# Patient Record
Sex: Female | Born: 1985 | Race: Black or African American | Hispanic: No | Marital: Married | Smoking: Never smoker
Health system: Southern US, Community
[De-identification: ages and names within clinical notes are randomized; demographics above are authoritative.]

## PROBLEM LIST (undated history)

## (undated) DIAGNOSIS — T7840XA Allergy, unspecified, initial encounter: Secondary | ICD-10-CM

## (undated) DIAGNOSIS — R51 Headache: Secondary | ICD-10-CM

## (undated) DIAGNOSIS — R519 Headache, unspecified: Secondary | ICD-10-CM

## (undated) DIAGNOSIS — D649 Anemia, unspecified: Secondary | ICD-10-CM

## (undated) HISTORY — DX: Headache: R51

## (undated) HISTORY — PX: DILATION AND CURETTAGE OF UTERUS: SHX78

## (undated) HISTORY — DX: Anemia, unspecified: D64.9

## (undated) HISTORY — DX: Headache, unspecified: R51.9

## (undated) HISTORY — PX: TONSILLECTOMY: SUR1361

## (undated) HISTORY — PX: WISDOM TOOTH EXTRACTION: SHX21

## (undated) HISTORY — PX: CHOLECYSTECTOMY: SHX55

## (undated) HISTORY — DX: Allergy, unspecified, initial encounter: T78.40XA

---

## 2004-03-12 ENCOUNTER — Inpatient Hospital Stay (HOSPITAL_COMMUNITY): Admission: AD | Admit: 2004-03-12 | Discharge: 2004-03-12 | Payer: Self-pay | Admitting: Obstetrics and Gynecology

## 2005-01-13 ENCOUNTER — Other Ambulatory Visit: Admission: RE | Admit: 2005-01-13 | Discharge: 2005-01-13 | Payer: Self-pay | Admitting: Obstetrics and Gynecology

## 2005-09-30 ENCOUNTER — Emergency Department (HOSPITAL_COMMUNITY): Admission: EM | Admit: 2005-09-30 | Discharge: 2005-10-01 | Payer: Self-pay | Admitting: Emergency Medicine

## 2009-05-15 ENCOUNTER — Emergency Department (HOSPITAL_COMMUNITY): Admission: EM | Admit: 2009-05-15 | Discharge: 2009-05-15 | Payer: Self-pay | Admitting: Emergency Medicine

## 2010-04-19 ENCOUNTER — Ambulatory Visit: Payer: Self-pay | Admitting: Oncology

## 2010-05-23 ENCOUNTER — Ambulatory Visit (HOSPITAL_COMMUNITY): Admission: RE | Admit: 2010-05-23 | Discharge: 2010-05-23 | Payer: Self-pay | Admitting: Obstetrics and Gynecology

## 2010-12-23 LAB — URINALYSIS, ROUTINE W REFLEX MICROSCOPIC
Bilirubin Urine: NEGATIVE
Ketones, ur: NEGATIVE mg/dL
Nitrite: NEGATIVE
Protein, ur: NEGATIVE mg/dL
Specific Gravity, Urine: >1.03 — ABNORMAL HIGH (ref 1.005–1.030)
Urobilinogen, UA: 1 mg/dL (ref 0.0–1.0)

## 2010-12-23 LAB — CBC
MCH: 18.9 pg — ABNORMAL LOW (ref 26.0–34.0)
MCHC: 31.6 g/dL (ref 30.0–36.0)
MCV: 59.9 fL — ABNORMAL LOW (ref 78.0–100.0)
Platelets: 360 10*3/uL (ref 150–400)
RDW: 22 % — ABNORMAL HIGH (ref 11.5–15.5)
WBC: 10.7 10*3/uL — ABNORMAL HIGH (ref 4.0–10.5)

## 2011-01-15 LAB — COMPREHENSIVE METABOLIC PANEL
ALT: 13 U/L (ref 0–35)
Alkaline Phosphatase: 70 U/L (ref 39–117)
BUN: 9 mg/dL (ref 6–23)
CO2: 24 mEq/L (ref 19–32)
Calcium: 9.3 mg/dL (ref 8.4–10.5)
GFR calc non Af Amer: >60 mL/min (ref >60)
Glucose, Bld: 99 mg/dL (ref 70–99)
Total Protein: 7.7 g/dL (ref 6.0–8.3)

## 2011-01-15 LAB — URINALYSIS, ROUTINE W REFLEX MICROSCOPIC
Bilirubin Urine: NEGATIVE
Ketones, ur: NEGATIVE mg/dL
Nitrite: NEGATIVE
Urobilinogen, UA: 1 mg/dL (ref 0.0–1.0)
pH: 6 (ref 5.0–8.0)

## 2011-01-15 LAB — DIFFERENTIAL
Basophils Relative: 0 % (ref 0–1)
Eosinophils Absolute: 0.1 10*3/uL (ref 0.0–0.7)
Eosinophils Relative: 1 % (ref 0–5)
Lymphocytes Relative: 13 % (ref 12–46)
Monocytes Absolute: 0.8 10*3/uL (ref 0.1–1.0)
Neutrophils Relative %: 79 % — ABNORMAL HIGH (ref 43–77)

## 2011-01-15 LAB — POCT PREGNANCY, URINE: Preg Test, Ur: NEGATIVE

## 2011-01-15 LAB — URINE MICROSCOPIC-ADD ON

## 2011-01-15 LAB — LIPASE, BLOOD: Lipase: 31 U/L (ref 11–59)

## 2011-01-15 LAB — CBC
HCT: 37.6 % (ref 36.0–46.0)
Hemoglobin: 12.8 g/dL (ref 12.0–15.0)
MCHC: 34 g/dL (ref 30.0–36.0)
RBC: 5.51 MIL/uL — ABNORMAL HIGH (ref 3.87–5.11)
RDW: 18 % — ABNORMAL HIGH (ref 11.5–15.5)

## 2011-02-19 IMAGING — US US ABDOMEN COMPLETE
1 series · 14 of 25 positions shown · non-contrast
Comparison: None.

CLINICAL DATA: Abdominal pain

COMPLETE ABDOMINAL ULTRASOUND

[Series 1: us abdomen complete · 0.30mm/px · 14 of 43 slices shown]
[im 1/43]
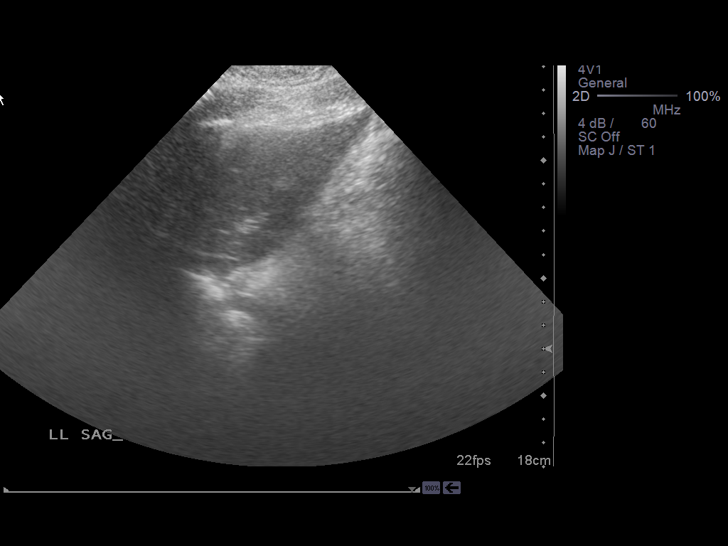
[im 4/43]
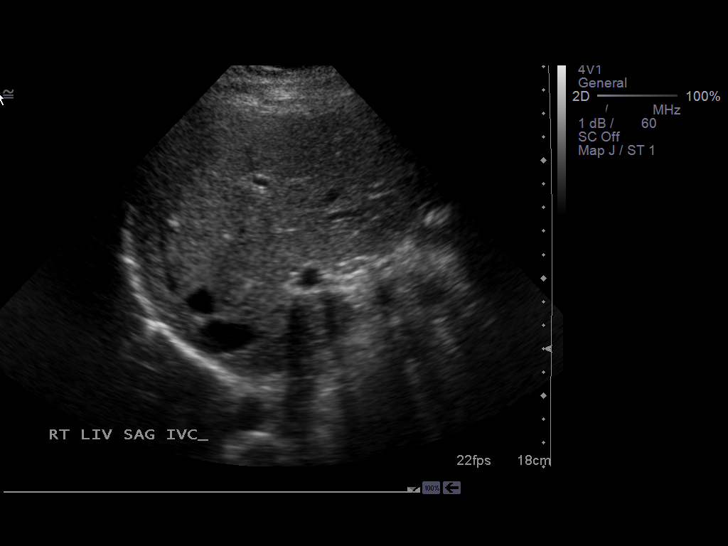
[im 8/43]
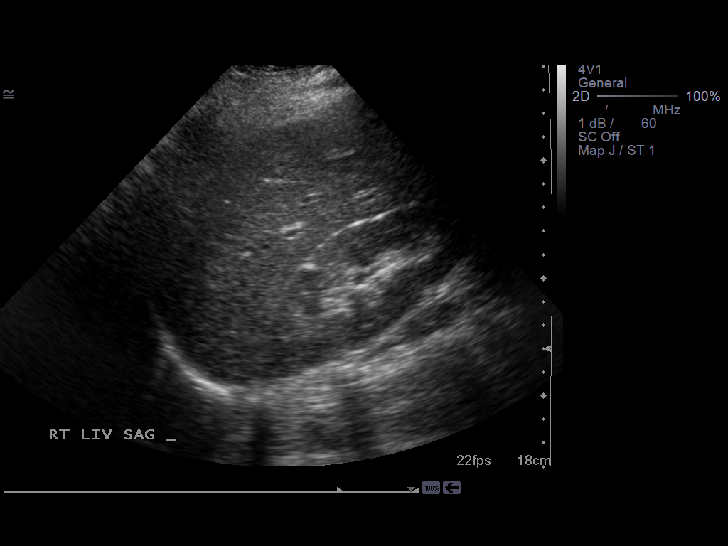
[im 11/43]
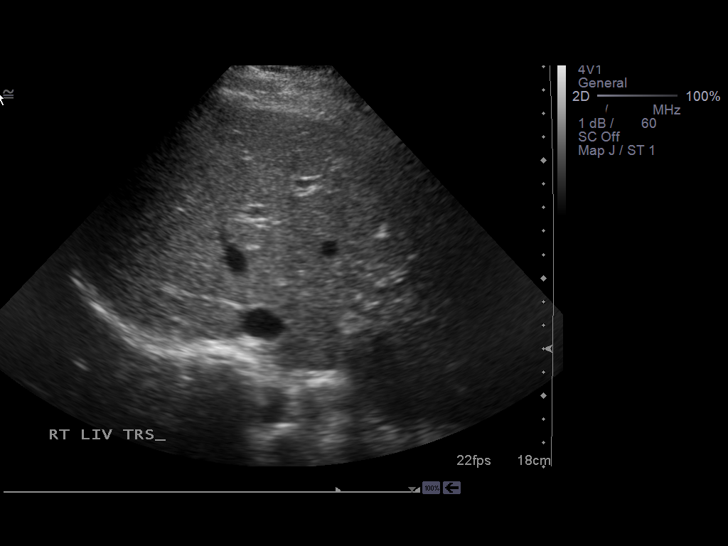
[im 15/43]
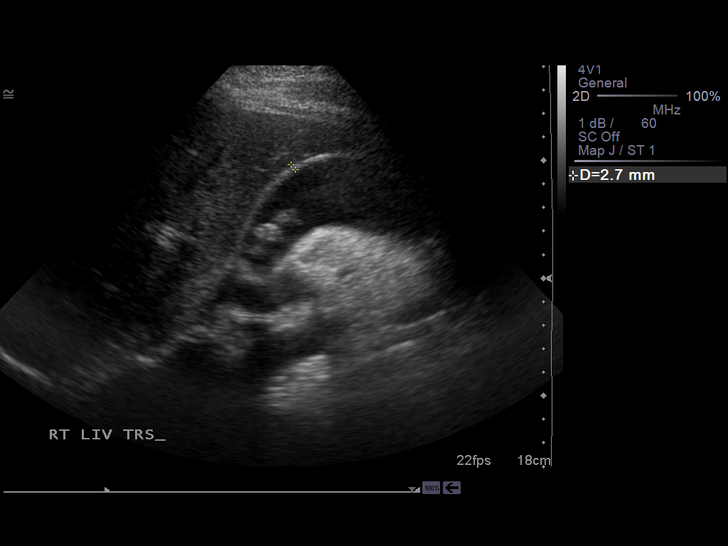
[im 16/43]
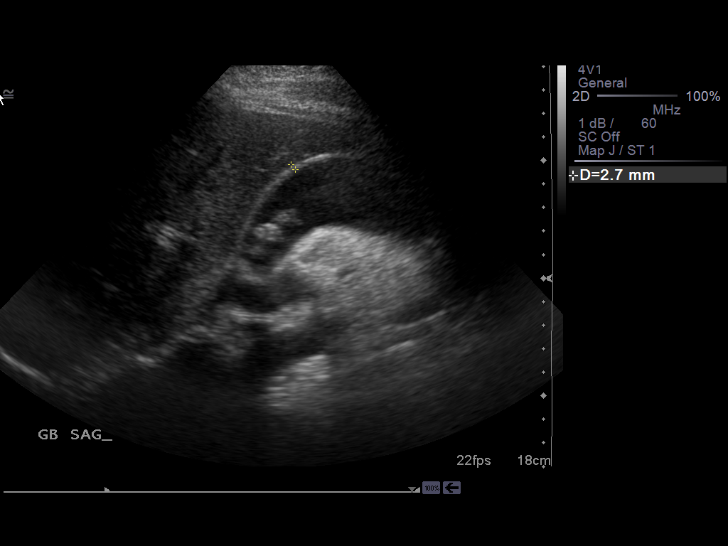
[im 20/43]
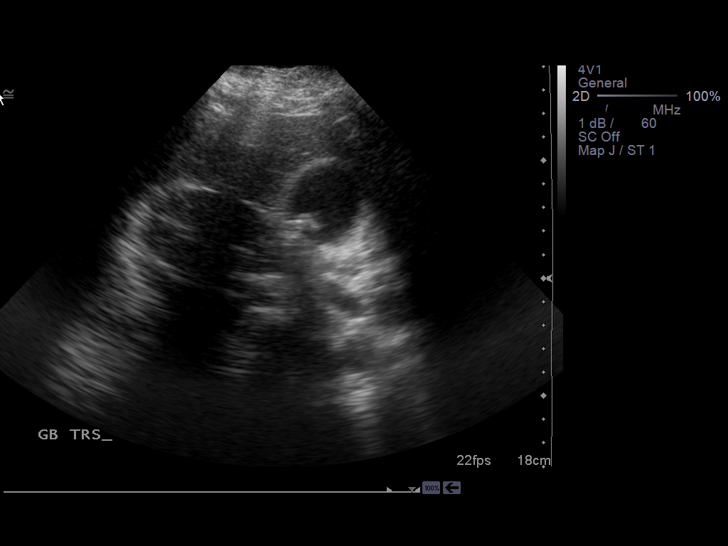
[im 23/43]
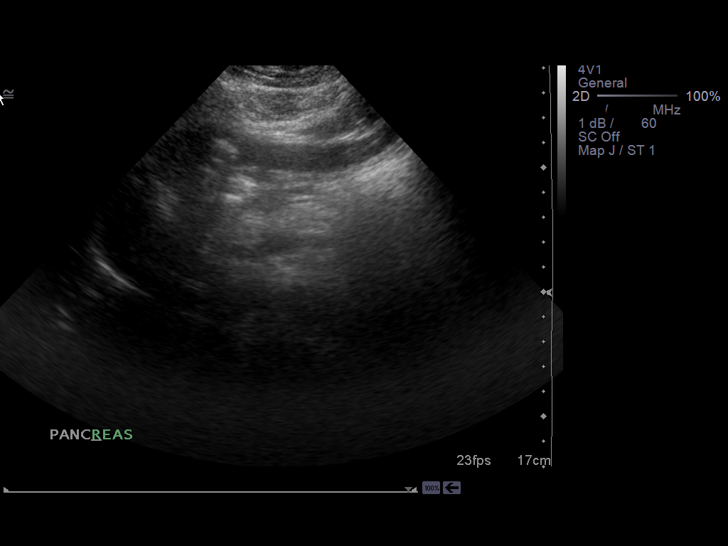
[im 27/43]
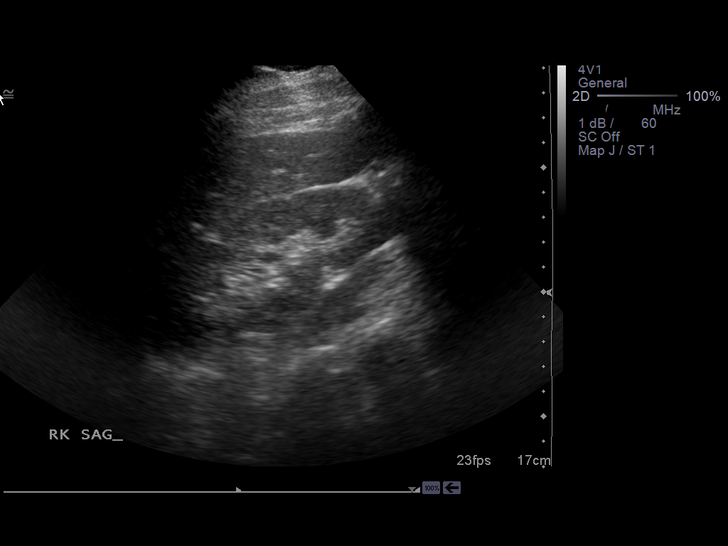
[im 29/43]
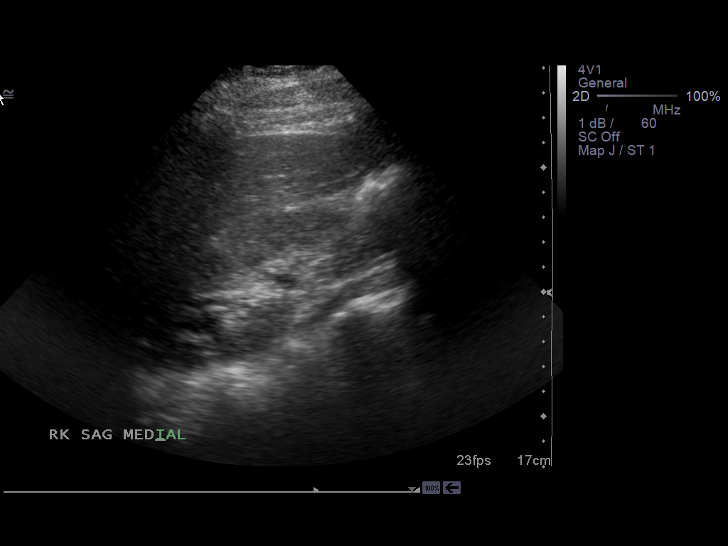
[im 32/43]
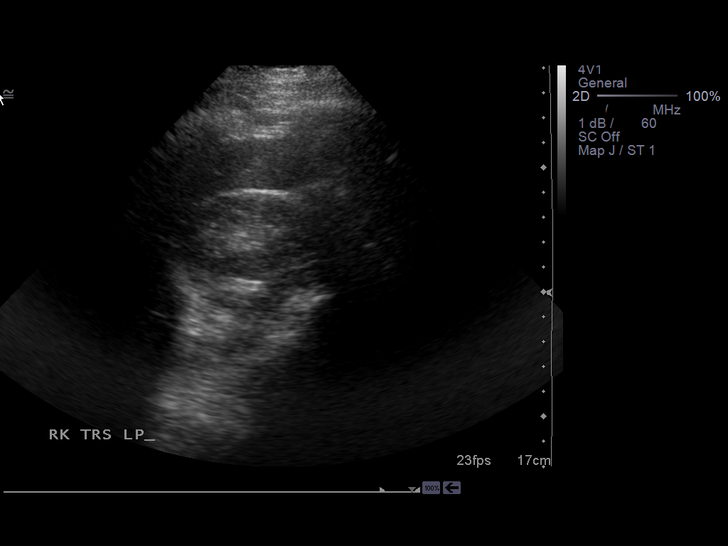
[im 36/43]
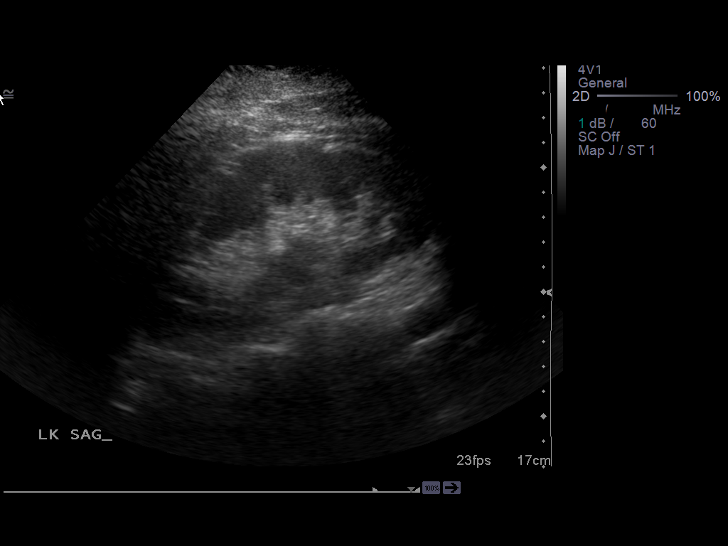
[im 39/43]
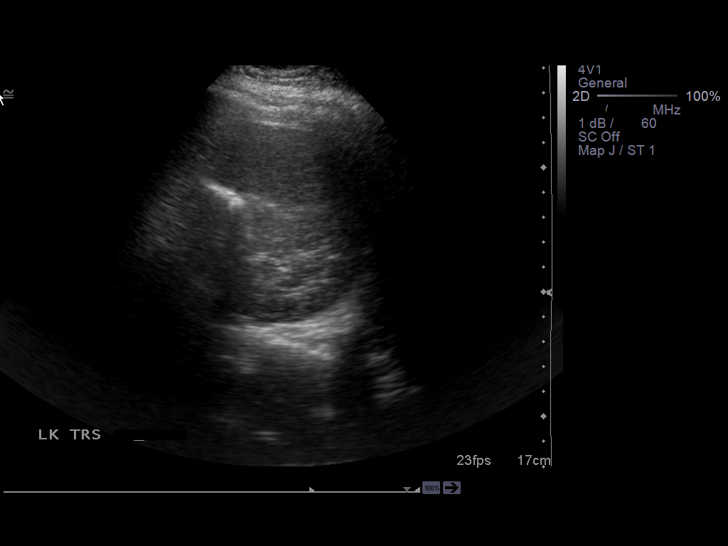
[im 43/43]
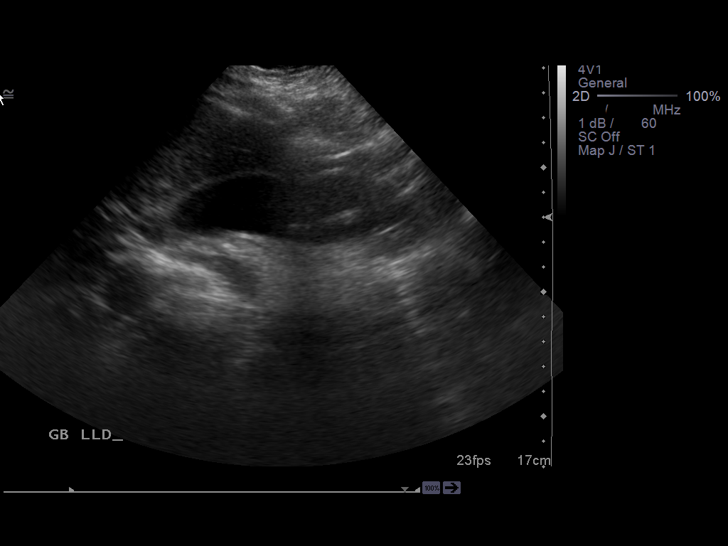

[14 of 25 positions shown; findings below may reference images not displayed]

FINDINGS: Gallbladder:  Multiple gallstones noted, several  are mobile
largest measures centimeter in diameter.  No gallbladder wall
thickening, Murphy's sign or pericholecystic fluid.  Gallbladder
wall thickness measures 2.7 mm.

Common bile duct:  Nondilated, within normal limits

Liver:  No focal abnormality, no intrahepatic biliary dilatation
identified.

IVC:  Within normal limits, patent.

Pancreas:  Limited visualization but no focal abnormality of the
imaged portion.

Spleen:  11.9 cm length.  No acute finding.

Right Kidney:  11.7 cm length.  No hydronephrosis or acute finding

Left Kidney:  12.2 cm length.  No hydronephrosis or acute finding

Abdominal aorta:  Negative for aneurysm.  Maximal diameter 2 cm

No free fluid
IMPRESSION: Cholelithiasis.
No biliary dilatation or other acute finding.

## 2011-03-01 ENCOUNTER — Emergency Department (HOSPITAL_COMMUNITY): Payer: BC Managed Care – PPO

## 2011-03-01 ENCOUNTER — Inpatient Hospital Stay (HOSPITAL_COMMUNITY)
Admission: EM | Admit: 2011-03-01 | Discharge: 2011-03-03 | DRG: 494 | Disposition: A | Discharge status: Home / Self Care | Payer: BC Managed Care – PPO | Attending: General Surgery | Admitting: General Surgery

## 2011-03-01 DIAGNOSIS — K810 Acute cholecystitis: Principal | ICD-10-CM | POA: Diagnosis present

## 2011-03-01 LAB — COMPREHENSIVE METABOLIC PANEL
ALT: 25 U/L (ref 0–35)
Alkaline Phosphatase: 70 U/L (ref 39–117)
BUN: 9 mg/dL (ref 6–23)
CO2: 25 mEq/L (ref 19–32)
Chloride: 104 mEq/L (ref 96–112)
GFR calc non Af Amer: >60 mL/min (ref >60)
Glucose, Bld: 95 mg/dL (ref 70–99)
Potassium: 3.5 mEq/L (ref 3.5–5.1)
Sodium: 137 mEq/L (ref 135–145)
Total Bilirubin: 0.5 mg/dL (ref 0.3–1.2)
Total Protein: 7.5 g/dL (ref 6.0–8.3)

## 2011-03-01 LAB — URINALYSIS, ROUTINE W REFLEX MICROSCOPIC
Bilirubin Urine: NEGATIVE
Ketones, ur: NEGATIVE mg/dL
Specific Gravity, Urine: 1.025 (ref 1.005–1.030)
Urobilinogen, UA: 2 mg/dL — ABNORMAL HIGH (ref 0.0–1.0)

## 2011-03-01 LAB — CBC
HCT: 35 % — ABNORMAL LOW (ref 36.0–46.0)
Hemoglobin: 12 g/dL (ref 12.0–15.0)
MCV: 61.4 fL — ABNORMAL LOW (ref 78.0–100.0)
RBC: 5.7 MIL/uL — ABNORMAL HIGH (ref 3.87–5.11)
WBC: 9.7 10*3/uL (ref 4.0–10.5)

## 2011-03-01 LAB — URINE MICROSCOPIC-ADD ON

## 2011-03-01 LAB — LIPASE, BLOOD: Lipase: 50 U/L (ref 11–59)

## 2011-03-01 LAB — POCT PREGNANCY, URINE: Preg Test, Ur: NEGATIVE

## 2011-03-02 ENCOUNTER — Other Ambulatory Visit: Payer: Self-pay | Admitting: General Surgery

## 2011-03-02 ENCOUNTER — Inpatient Hospital Stay (HOSPITAL_COMMUNITY): Payer: BC Managed Care – PPO

## 2011-03-02 LAB — SURGICAL PCR SCREEN
MRSA, PCR: NEGATIVE
Staphylococcus aureus: NEGATIVE

## 2011-03-02 LAB — URINE CULTURE

## 2011-03-07 NOTE — Op Note (Signed)
Michele Warren, Michele Warren               ACCOUNT NO.:  [id number]  MEDICAL RECORD NO.:  [id number]           PATIENT TYPE:  I  LOCATION:  5151                         FACILITY:  [facility]  PHYSICIAN:  Gabrielle Dare. [name], M.D.DATE OF BIRTH:  04-26-1986  DATE OF PROCEDURE:  03/02/2011 DATE OF DISCHARGE:                              OPERATIVE REPORT   PREOPERATIVE DIAGNOSIS:  Acute cholecystitis.  POSTOPERATIVE DIAGNOSIS:  Acute cholecystitis.  PROCEDURE:  Laparoscopic cholecystectomy with intraoperative cholangiogram.  SURGEON:  Gabrielle Dare. [name], MD  ASSISTANT:  Brayton El, PA-C  HISTORY OF PRESENT ILLNESS:  Michele Warren is a 25 year old African American female who was admitted with acute cholecystitis.  She was brought to the operating room for cholecystectomy.  PROCEDURE IN DETAIL:  Informed consent was obtained and the patient was identified in the preop holding area.  She received intravenous antibiotics on a regimen.  General endotracheal anesthesia was administered by Anesthesia staff.  Her abdomen was prepped and draped in a sterile fashion.  We did a time-out procedure.  The infraumbilical region was infiltrated with 0.25% Marcaine with epinephrine. Infraumbilical incision was made.  Subcutaneous tissues were dissected down revealing the anterior fascia.  This was divided sharply.  The peritoneal cavity was entered under direct vision without difficulty.  A 0-Vicryl pursestring suture was placed around the fascial opening and the Hasson trocar was inserted in the abdomen.  The abdomen was insufflated with carbon dioxide in standard fashion.  Under direct vision, a 5-mm epigastric and two 5-mm right lateral ports were placed with local anesthetic used at all port sites.  Laparoscopic exploration revealed acute inflammation of the gallbladder.  The abdomen was retracted superomedially.  The infundibulum was retracted inferolaterally.  Dissection began laterally and progressed  medially easily identifying the cystic duct.  This was circumferentially dissected until a large window was made between the infundibulum, cystic duct, and the liver.  Once we had a critical view, a clip was placed on the infundibulum-cystic duct junction.  A small nick was made in the cystic duct and a Cook cholangiogram catheter was inserted. Intraoperative cholangiogram was obtained demonstrating no common bile duct filling defects and good flow of contrast into the duodenum.  The cholangiogram catheter was removed.  Three clips were placed proximally on the cystic duct and it was divided.  Further dissection revealed the anterior and posterior branch of the cystic artery.  These were clipped twice proximally and divided distally with cautery.  The gallbladder was taken off the liver bed with Bovie cautery achieving excellent hemostasis along the way.  The gallbladder was placed in an EndoCatch bag and removed from the abdomen via the infraumbilical port sites.  The liver bed was then double checked.  Hemostasis was assured.  The abdomen was copiously irrigated.  The irrigation fluid returned clear.  Clips were noted to be in good position and the liver bed remained dry.  Ports were removed under direct vision.  Pneumoperitoneum was released.  The infraumbilical fascia was closed by tying a 0-Vicryl pursestring suture with care not to trap the intra-abdominal contents.  All 4 wounds were copiously irrigated and  the skin of each was closed with running 4-0 Vicryl subcuticular stitch followed by Dermabond.  Sponge, needle, and instrument counts were all correct.  The patient tolerated the procedure well without apparent complication and was taken to the recovery room in stable condition.     Gabrielle Dare [name], M.D.     BET/MEDQ  D:  03/02/2011  T:  03/03/2011  Job:  [id number]  Electronically Signed by Violeta Gelinas M.D. on 03/07/2011 01:16:39 PM

## 2011-03-07 NOTE — H&P (Signed)
  NAMEAARNA, MIHALKO NO.:  [id number]  MEDICAL RECORD NO.:  [id number]           PATIENT TYPE:  I  LOCATION:  5151                         FACILITY:  [facility]  PHYSICIAN:  Juanetta Gosling, MDDATE OF BIRTH:  12/19/1985  DATE OF ADMISSION:  03/01/2011 DATE OF DISCHARGE:                             HISTORY & PHYSICAL   CHIEF COMPLAINT:  Abdominal pain.  HISTORY OF PRESENT ILLNESS:  This a 25 year old female who is otherwise fairly healthy who had an episode of right upper quadrant pain and right back pain that she noted in 2010 when she was evaluated and diagnosed with cholelithiasis.  She did fine after that and did not seek any other medical attention until about 2 days ago when she began developing severe right upper quadrant pain associated with right back pain as well.  She has had nausea and vomiting associated with that.  She has also had some loose stool.  She denies any fever.  She has had some chills.  She is eating only a small amount secondary to the pain.  She has got no real relief which prompted her to come to the emergency room. Aggravating factors are eating and movement.  PAST MEDICAL HISTORY:  Negative.  PAST SURGICAL HISTORY:  D and C and tonsillectomy.  FAMILY HISTORY:  Positive for gallstones.  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 She drinks occasional alcohol and is a nonsmoker.  DRUG ALLERGIES:  None known.  She does, however, state she has "a weird feeling" to hydrocodone and codeine.  MEDICATIONS:  Oral contraceptives.  REVIEW OF SYSTEMS:  Otherwise negative.  PHYSICAL EXAMINATION:  VITAL SIGNS:  Temperature 98.0, heart rate 88, respiratory rate 20, and blood pressure 117/86. GENERAL:  She is a well-appearing female in no distress. NECK:  Supple without adenopathy. HEART:  Regular rate and rhythm. LUNGS:  Clear bilaterally. ABDOMEN:  Soft, obese.  She has some tenderness of the right  upper quadrant with a positive Murphy sign.  Ultrasound evaluation shows a wall like of shadow complex with multiple stones, sonographic Murphy sign, and wall thickness of 1.2 mm.  Common duct is 4.4 mm in size.  Her urinalysis shows she has small leukocytes, moderate blood, 2+ urobilinogen.  Her urine microscopy shows 7-10 white cells, many bacteria, and few squamous cells.  Urine pregnancy test is negative.  Lipase is 50.  Her CMP is unremarkable except for an albumin of 3.4.  Her CBC shows a white blood cell count of 9.7, hematocrit 35, and platelets of 358.  ASSESSMENT:  Acute cholecystitis.  PLAN: 1. Admission, n.p.o., IV antibiotics. 2. We discussed laparoscopic cholecystectomy as the treatment for     cholecystitis and the risks and benefits associated with that.  She     understands and this will be performed tomorrow by one of my     partners.     Juanetta Gosling, MD     MCW/MEDQ  D:  03/01/2011  T:  03/02/2011  Job:  [id number]  Electronically Signed by Emelia Loron MD on 03/07/2011 09:09:37 PM

## 2011-03-14 ENCOUNTER — Encounter (INDEPENDENT_AMBULATORY_CARE_PROVIDER_SITE_OTHER): Payer: Self-pay | Admitting: General Surgery

## 2012-12-05 IMAGING — US US ABDOMEN COMPLETE
1 series · 13 of 25 positions shown · non-contrast
Comparison: 05/15/2009

CLINICAL DATA: Abdominal pain.

COMPLETE ABDOMINAL ULTRASOUND

[Series 1: us abdomen complete · 0.30mm/px · 13 of 47 slices shown]
[im 1/47]
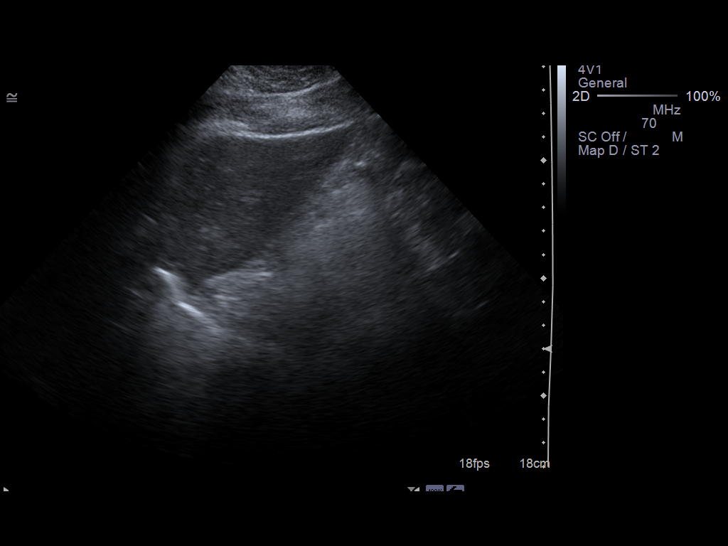
[im 4/47]
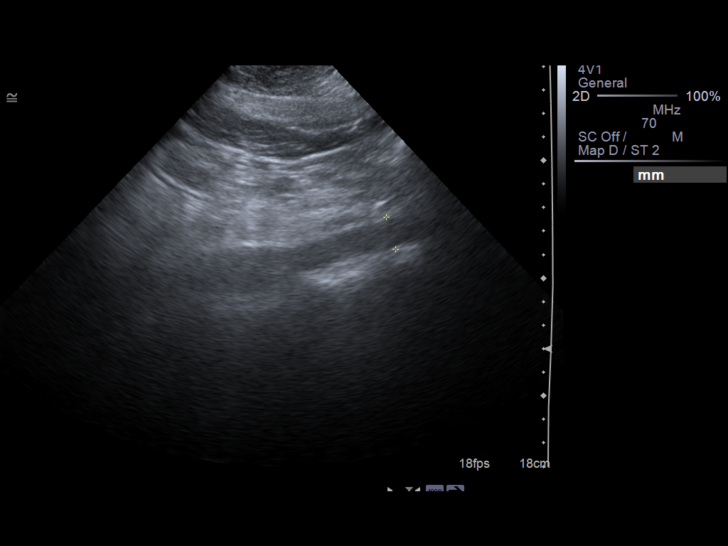
[im 8/47]
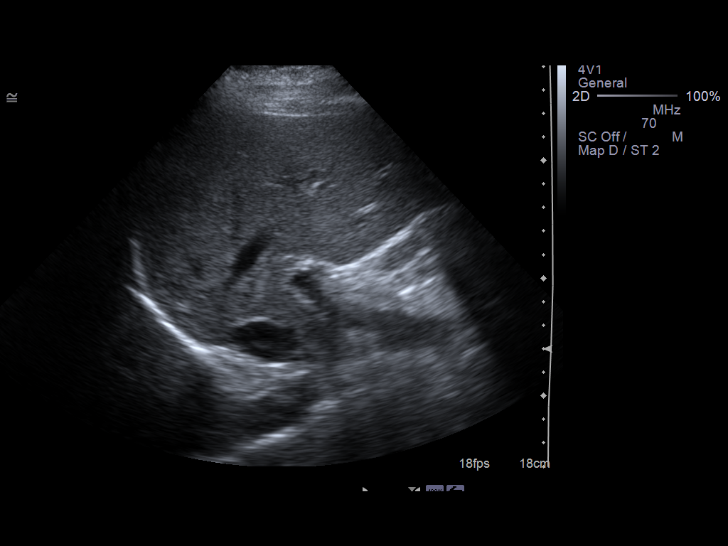
[im 12/47]
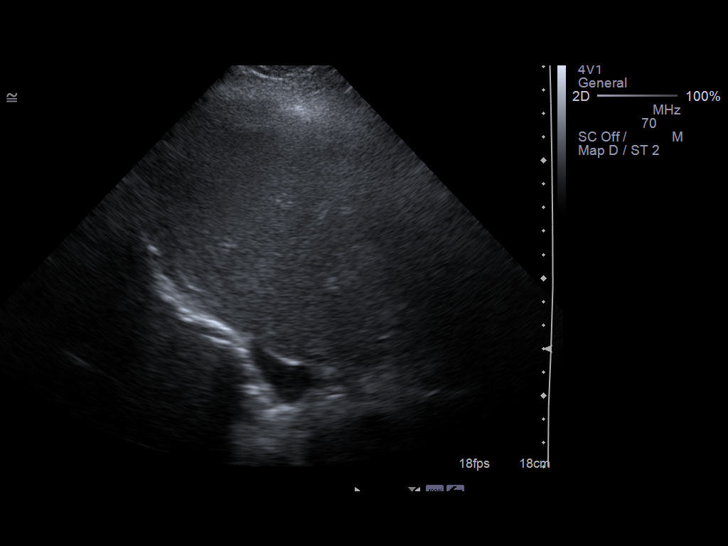
[im 16/47]
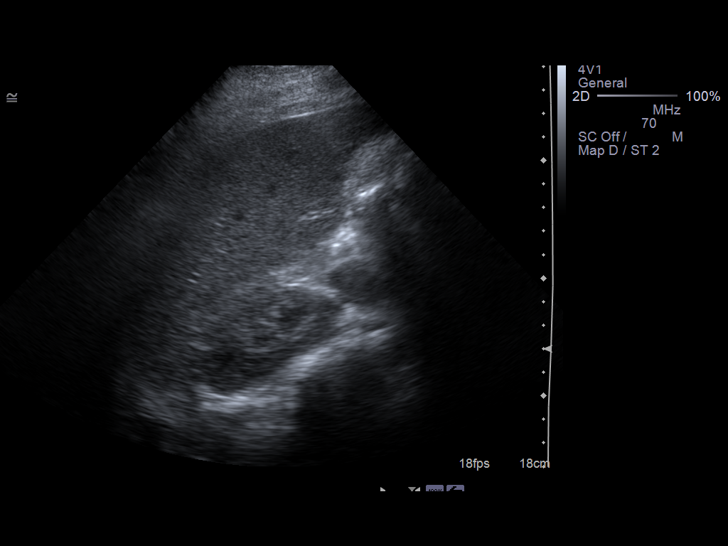
[im 20/47]
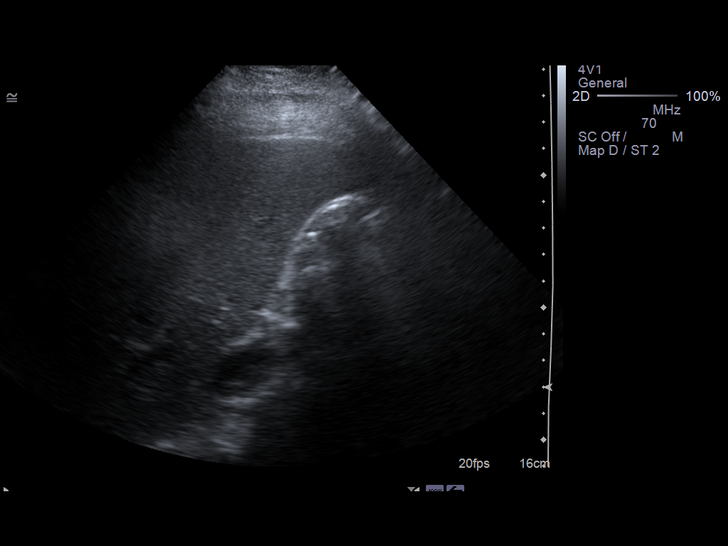
[im 24/47]
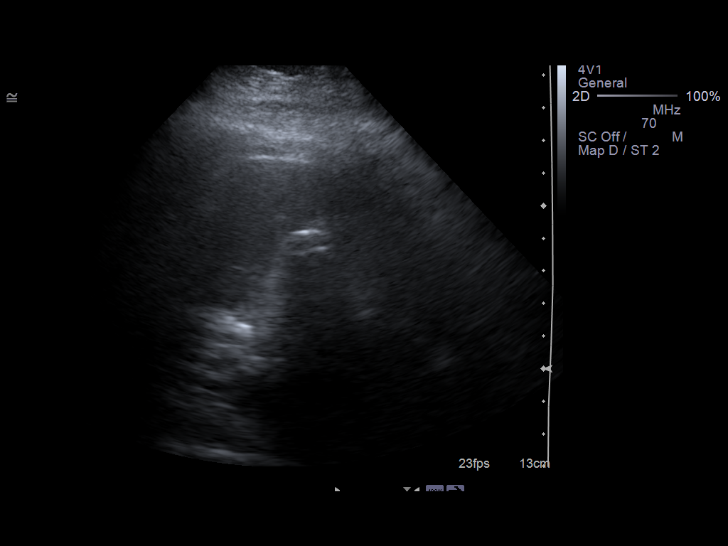
[im 27/47]
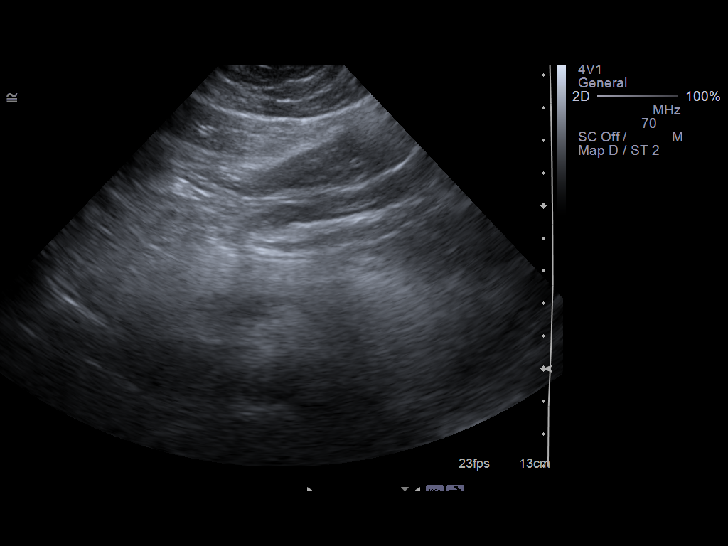
[im 31/47]
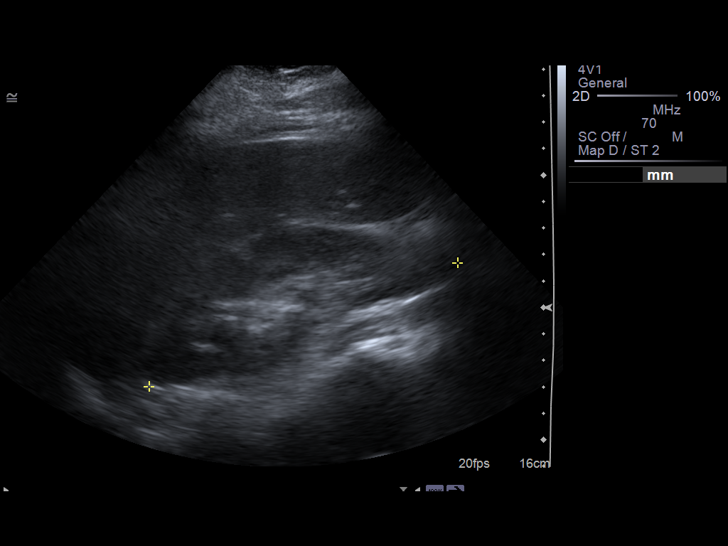
[im 35/47]
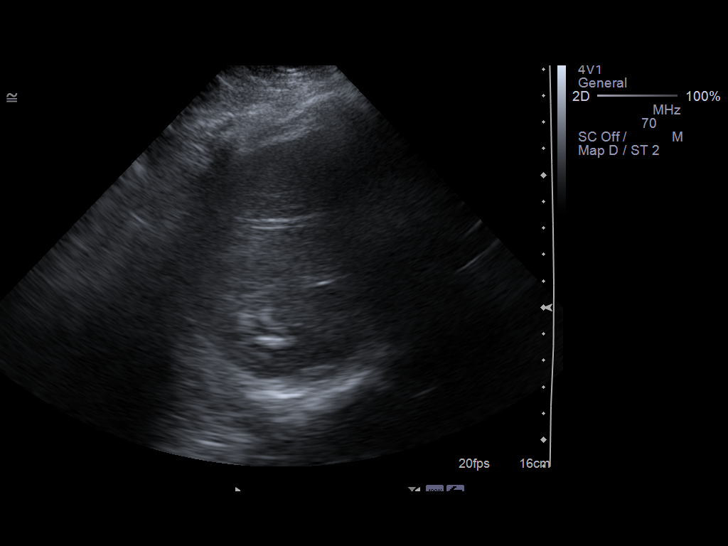
[im 39/47]
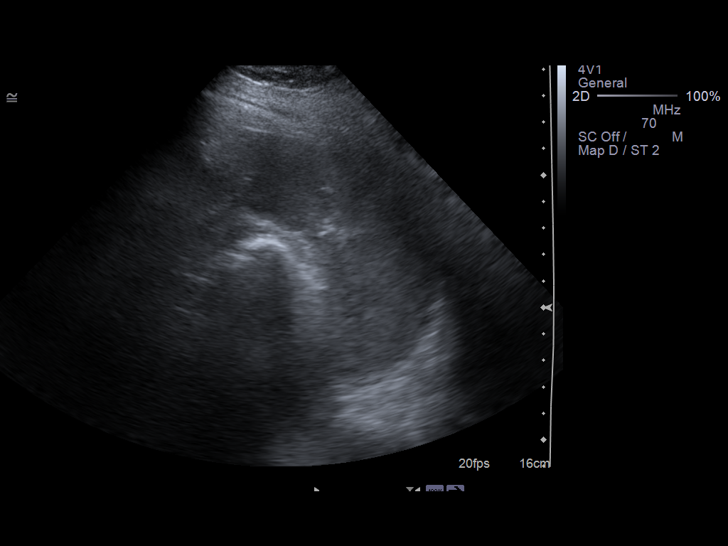
[im 43/47]
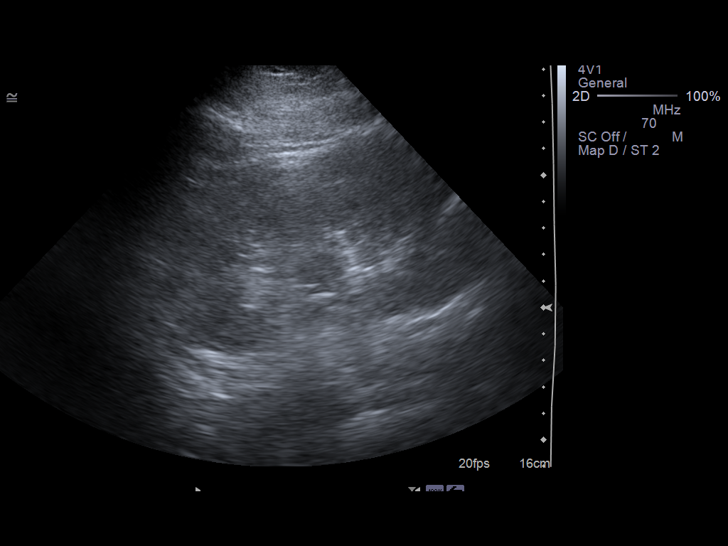
[im 47/47]
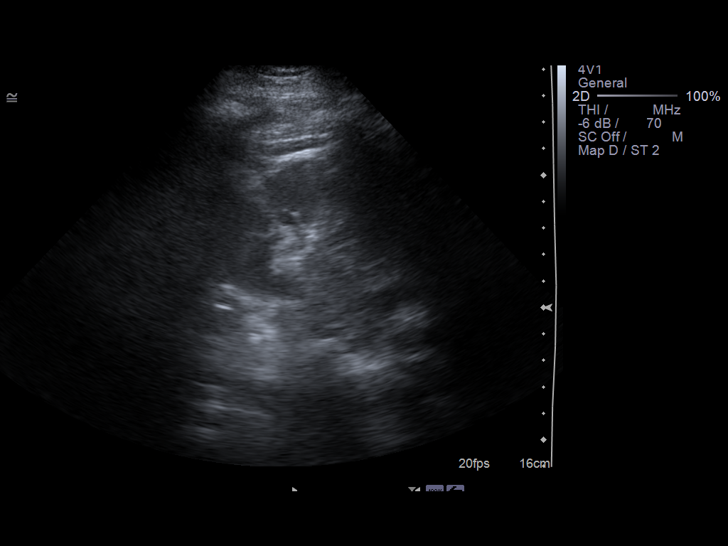

[13 of 25 positions shown; findings below may reference images not displayed]

FINDINGS: Gallbladder:  A wall echo shadow complex is present in the
gallbladder.  Multiple stones are noted.  There is a sonographic
Murphy's sign.  Wall thickness is still within normal limits at
mm.

Common bile duct:  Normal in caliber. No biliary ductal
dilation.The maximal diameter is 4.4 mm.

Liver:  No focal lesion identified.  Within normal limits in
parenchymal echogenicity.

IVC:  Appears normal.

Pancreas:  The pancreas is poorly visualized due to overlying bowel
gas.

Spleen:  Normal size and echotexture without focal parenchymal
abnormality.  Spleen length is 7.8 cm.

Right Kidney:  No hydronephrosis.  Well-preserved cortex.  Normal
size and parenchymal echotexture without focal abnormalities.   The
maximal length is 12.6 cm.

Left Kidney:  No hydronephrosis.  Well-preserved cortex.  Normal
size and parenchymal echotexture without focal abnormalities.
Maximal length is 13.0 cm.

Abdominal aorta:  The maximal diameters 2.0 cm, within normal
limits.
IMPRESSION: 1.Wall echo shadow complex of the gallbladder with a positive
sonographic Murphy's sign suggests acute cholecystitis.
2.  Limited visualization of the pancreas due to overlying bowel
gas.

## 2013-02-13 ENCOUNTER — Ambulatory Visit (INDEPENDENT_AMBULATORY_CARE_PROVIDER_SITE_OTHER): Payer: BC Managed Care – PPO | Admitting: Family Medicine

## 2013-02-13 VITALS — BP 131/81 | HR 77 | Temp 98.1°F | Resp 16 | Ht 69.58 in | Wt 262.8 lb

## 2013-02-13 DIAGNOSIS — J309 Allergic rhinitis, unspecified: Secondary | ICD-10-CM

## 2013-02-13 MED ORDER — FLUTICASONE PROPIONATE 50 MCG/ACT NA SUSP: 2.0000 | Freq: Every day | NASAL | Status: DC

## 2013-02-13 MED ORDER — PREDNISONE 20 MG PO TABS: ORAL_TABLET | ORAL | Status: DC

## 2013-02-13 NOTE — Progress Notes (Signed)
Subjective: 27 year old lady who has been having several days of sinus pressure. Feels like her head is going to explode. Her mucus has been clear to yellowish. She is sniffling constantly. She has been sneezing and coughing. She's tried various OTC medications.  Objective: Her TMs are normal. Sinuses mildly tender. Nose very congested. Throat clear. Neck supple without nodes. Chest is clear to auscultation. Heart regular without murmurs.  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

## 2013-02-13 NOTE — Patient Instructions (Signed)
Allergic Rhinitis Allergic rhinitis is when the mucous membranes in the nose respond to allergens. Allergens are particles in the air that cause your body to have an allergic reaction. This causes you to release allergic antibodies. Through a chain of events, these eventually cause you to release histamine into the blood stream (hence the use of antihistamines). Although meant to be protective to the body, it is this release that causes your discomfort, such as frequent sneezing, congestion and an itchy runny nose.  CAUSES  The pollen allergens may come from grasses, trees, and weeds. This is seasonal allergic rhinitis, or "hay fever." Other allergens cause year-round allergic rhinitis (perennial allergic rhinitis) such as house dust mite allergen, pet dander and mold spores.  SYMPTOMS   Nasal stuffiness (congestion).  Runny, itchy nose with sneezing and tearing of the eyes.  There is often an itching of the mouth, eyes and ears. It cannot be cured, but it can be controlled with medications. DIAGNOSIS  If you are unable to determine the offending allergen, skin or blood testing may find it. TREATMENT   Avoid the allergen.  Medications and allergy shots (immunotherapy) can help.  Hay fever may often be treated with antihistamines in pill or nasal spray forms. Antihistamines block the effects of histamine. There are over-the-counter medicines that may help with nasal congestion and swelling around the eyes. Check with your caregiver before taking or giving this medicine. If the treatment above does not work, there are many new medications your caregiver can prescribe. Stronger medications may be used if initial measures are ineffective. Desensitizing injections can be used if medications and avoidance fails. Desensitization is when a patient is given ongoing shots until the body becomes less sensitive to the allergen. Make sure you follow up with your caregiver if problems continue. SEEK MEDICAL  CARE IF:   You develop fever (more than 100.5 F (38.1 C).  You develop a cough that does not stop easily (persistent).  You have shortness of breath.  You start wheezing.  Symptoms interfere with normal daily activities. Document Released: 06/20/2001 Document Revised: 12/18/2011 Document Reviewed: 12/30/2008 [facility] Patient Information 2013 [place], [place].    Take Zyrtec-D one daily  Take the prednisone 3 daily for 2 days, then 2 daily for 2 days, then one daily for 2 days. Take all the pills together each day  Use the fluticasone nose spray 2 sprays each nostril twice daily for 3 days, then decrease to the usual 2 sprays each nostril once daily  Lots of fluids  Return if worse

## 2014-05-24 ENCOUNTER — Encounter (HOSPITAL_COMMUNITY): Payer: Self-pay | Admitting: Emergency Medicine

## 2014-05-24 ENCOUNTER — Emergency Department (HOSPITAL_COMMUNITY)
Admission: EM | Admit: 2014-05-24 | Discharge: 2014-05-24 | Disposition: A | Discharge status: Home / Self Care | Payer: BC Managed Care – PPO | Attending: Emergency Medicine | Admitting: Emergency Medicine

## 2014-05-24 DIAGNOSIS — Z8709 Personal history of other diseases of the respiratory system: Secondary | ICD-10-CM | POA: Insufficient documentation

## 2014-05-24 DIAGNOSIS — Z791 Long term (current) use of non-steroidal anti-inflammatories (NSAID): Secondary | ICD-10-CM | POA: Diagnosis not present

## 2014-05-24 DIAGNOSIS — R109 Unspecified abdominal pain: Secondary | ICD-10-CM | POA: Insufficient documentation

## 2014-05-24 DIAGNOSIS — Z79899 Other long term (current) drug therapy: Secondary | ICD-10-CM | POA: Diagnosis not present

## 2014-05-24 DIAGNOSIS — Z3202 Encounter for pregnancy test, result negative: Secondary | ICD-10-CM | POA: Insufficient documentation

## 2014-05-24 DIAGNOSIS — N898 Other specified noninflammatory disorders of vagina: Secondary | ICD-10-CM | POA: Diagnosis not present

## 2014-05-24 DIAGNOSIS — N390 Urinary tract infection, site not specified: Secondary | ICD-10-CM | POA: Diagnosis not present

## 2014-05-24 LAB — URINE MICROSCOPIC-ADD ON

## 2014-05-24 LAB — COMPREHENSIVE METABOLIC PANEL
ALBUMIN: 3.8 g/dL (ref 3.5–5.2)
ALT: 11 U/L (ref 0–35)
AST: 18 U/L (ref 0–37)
Alkaline Phosphatase: 68 U/L (ref 39–117)
Anion gap: 12 (ref 5–15)
BUN: 9 mg/dL (ref 6–23)
CALCIUM: 9.3 mg/dL (ref 8.4–10.5)
CO2: 24 mEq/L (ref 19–32)
Chloride: 103 mEq/L (ref 96–112)
Creatinine, Ser: 0.68 mg/dL (ref 0.50–1.10)
GFR calc non Af Amer: >90 mL/min (ref >90)
Glucose, Bld: 78 mg/dL (ref 70–99)
POTASSIUM: 4.2 meq/L (ref 3.7–5.3)
Sodium: 139 mEq/L (ref 137–147)
TOTAL PROTEIN: 7.4 g/dL (ref 6.0–8.3)
Total Bilirubin: 0.8 mg/dL (ref 0.3–1.2)

## 2014-05-24 LAB — CBC WITH DIFFERENTIAL/PLATELET
Basophils Absolute: 0 10*3/uL (ref 0.0–0.1)
Basophils Relative: 0 % (ref 0–1)
Eosinophils Absolute: 0.1 10*3/uL (ref 0.0–0.7)
Eosinophils Relative: 1 % (ref 0–5)
HEMATOCRIT: 40 % (ref 36.0–46.0)
HEMOGLOBIN: 13.6 g/dL (ref 12.0–15.0)
LYMPHS ABS: 1.9 10*3/uL (ref 0.7–4.0)
LYMPHS PCT: 23 % (ref 12–46)
MCH: 23.1 pg — ABNORMAL LOW (ref 26.0–34.0)
MCHC: 34 g/dL (ref 30.0–36.0)
MCV: 68 fL — ABNORMAL LOW (ref 78.0–100.0)
MONO ABS: 0.5 10*3/uL (ref 0.1–1.0)
MONOS PCT: 6 % (ref 3–12)
NEUTROS ABS: 6 10*3/uL (ref 1.7–7.7)
NEUTROS PCT: 70 % (ref 43–77)
Platelets: 319 10*3/uL (ref 150–400)
RBC: 5.88 MIL/uL — AB (ref 3.87–5.11)
RDW: 16.7 % — ABNORMAL HIGH (ref 11.5–15.5)
WBC: 8.5 10*3/uL (ref 4.0–10.5)

## 2014-05-24 LAB — URINALYSIS, ROUTINE W REFLEX MICROSCOPIC
Bilirubin Urine: NEGATIVE
GLUCOSE, UA: NEGATIVE mg/dL
KETONES UR: NEGATIVE mg/dL
NITRITE: NEGATIVE
PH: 6 (ref 5.0–8.0)
Protein, ur: NEGATIVE mg/dL
SPECIFIC GRAVITY, URINE: 1.02 (ref 1.005–1.030)
Urobilinogen, UA: 1 mg/dL (ref 0.0–1.0)

## 2014-05-24 LAB — WET PREP, GENITAL
CLUE CELLS WET PREP: NONE SEEN
TRICH WET PREP: NONE SEEN
Yeast Wet Prep HPF POC: NONE SEEN

## 2014-05-24 LAB — POC URINE PREG, ED: Preg Test, Ur: NEGATIVE

## 2014-05-24 LAB — LIPASE, BLOOD: Lipase: 42 U/L (ref 11–59)

## 2014-05-24 MED ORDER — CEPHALEXIN 500 MG PO CAPS: 500.0000 mg | ORAL_CAPSULE | Freq: Three times a day (TID) | ORAL | Status: DC

## 2014-05-24 MED ORDER — KETOROLAC TROMETHAMINE 30 MG/ML IJ SOLN
30.0000 mg | Freq: Once | INTRAMUSCULAR | Status: AC
  Administered 2014-05-24: 30 mg via INTRAVENOUS
  Filled 2014-05-24: qty 1

## 2014-05-24 MED ORDER — SODIUM CHLORIDE 0.9 % IV BOLUS (SEPSIS)
1000.0000 mL | Freq: Once | INTRAVENOUS | Status: AC
  Administered 2014-05-24: 1000 mL via INTRAVENOUS

## 2014-05-24 MED ORDER — PHENAZOPYRIDINE HCL 200 MG PO TABS: 200.0000 mg | ORAL_TABLET | Freq: Three times a day (TID) | ORAL | Status: DC

## 2014-05-24 NOTE — ED Provider Notes (Signed)
Medical screening examination/treatment/procedure(s) were performed by non-physician practitioner and as supervising physician I was immediately available for consultation/collaboration.   EKG Interpretation None        Glynn OctaveStephen Dashanique Brownstein, MD 05/24/14 1623

## 2014-05-24 NOTE — ED Notes (Signed)
Patient presents stating that this AM she went to void and had painful urination and an odor to the urine.

## 2014-05-24 NOTE — ED Notes (Signed)
Phlebotomy at bedside.

## 2014-05-24 NOTE — Discharge Instructions (Signed)

## 2014-05-24 NOTE — ED Provider Notes (Signed)
CSN: [id number]     Arrival date & time 05/24/14  0559 History   First MD Initiated Contact with Patient 05/24/14 [phone]     Chief Complaint  Patient presents with  . Abdominal Pain   Patient is a 28 y.o. female presenting with abdominal pain.  Abdominal Pain   Patient is a 28 y.o. Female who presents to the ED with suprapubic abdominal pain, back pain, dysuria, urinary urgency, and frequency x 1 day.  Patient states that she woke up this morning and had sharp pain while urinating.  Patient states that she had a UTI approximately 1 month ago but was treated for it.  She states that this feels very similar to at UTI but worse.  She has not had tried any type of relieving factors at this time.  Patient states that she has not had any fever, chills, nausea, vomiting, diarrhea, constipation, melena, hematochezia, hematuria, shortness of breath, or chest pain.  Patient denies any vaginal discharge, vaginal rash, or vaginal pain.  Patient states that she is otherwise healthy.       Past Medical History  Diagnosis Date  . Allergy    Past Surgical History  Procedure Laterality Date  . Cholecystectomy     Family History  Problem Relation Age of Onset  . Cancer Maternal Grandmother    History  Substance Use Topics  . Smoking status: Never Smoker   . Smokeless tobacco: Never Used  . Alcohol Use: No   OB History   Grav Para Term Preterm Abortions TAB SAB Ect Mult Living                 Review of Systems  Gastrointestinal: Positive for abdominal pain.    See HPI  Allergies  Hydrocodone  Home Medications   Prior to Admission medications   Medication Sig Start Date End Date Taking? Authorizing Provider  ibuprofen (ADVIL,MOTRIN) 200 MG tablet Take 800 mg by mouth every 6 (six) hours as needed for mild pain.   Yes Historical Provider, MD  levonorgestrel-ethinyl estradiol (ALTAVERA) 0.15-30 MG-MCG tablet Take 1 tablet by mouth daily.   Yes Historical Provider, MD  cephALEXin (KEFLEX)  500 MG capsule Take 1 capsule (500 mg total) by mouth 3 (three) times daily. 05/24/14   Valerie Cones A Forcucci, PA-C  phenazopyridine (PYRIDIUM) 200 MG tablet Take 1 tablet (200 mg total) by mouth 3 (three) times daily. 05/24/14   Amauria Younts A Forcucci, PA-C   BP 129/77  Pulse 70  Temp(Src) 98.2 F (36.8 C) (Oral)  Resp 16  Ht 5\' 9"  (1.753 m)  Wt 265 lb (120.203 kg)  BMI 39.12 kg/m2  SpO2 99%  LMP 05/24/2014 Physical Exam  Nursing note and vitals reviewed. Constitutional: She is oriented to person, place, and time. She appears well-developed and well-nourished. No distress.  HENT:  Head: Normocephalic and atraumatic.  Mouth/Throat: Oropharynx is clear and moist. No oropharyngeal exudate.  Eyes: Conjunctivae are normal. No scleral icterus.  Neck: Normal range of motion. Neck supple. No JVD present. No thyromegaly present.  Cardiovascular: Normal rate, regular rhythm and intact distal pulses.  Exam reveals no gallop and no friction rub.   No murmur heard. Pulmonary/Chest: Effort normal and breath sounds normal. No respiratory distress. She has no wheezes. She has no rales. She exhibits no tenderness.  Abdominal: Soft. Normal appearance and bowel sounds are normal. She exhibits no distension and no mass. There is tenderness in the suprapubic area. There is no rebound, no guarding, no CVA tenderness,  no tenderness at McBurney's point and negative Murphy's sign.  Genitourinary: Uterus normal. No labial fusion. There is no rash, tenderness, lesion or injury on the right labia. There is no rash, tenderness, lesion or injury on the left labia. Cervix exhibits no motion tenderness, no discharge and no friability. Right adnexum displays no mass, no tenderness and no fullness. Left adnexum displays no mass, no tenderness and no fullness. There is bleeding around the vagina. No erythema or tenderness around the vagina. No foreign body around the vagina. No signs of injury around the vagina. No vaginal  discharge found.  Musculoskeletal: Normal range of motion.  Lymphadenopathy:    She has no cervical adenopathy.  Neurological: She is alert and oriented to person, place, and time. No cranial nerve deficit. Coordination normal.  Skin: Skin is warm and dry. She is not diaphoretic.  Psychiatric: She has a normal mood and affect. Her behavior is normal. Judgment and thought content normal.    ED Course  Procedures (including critical care time) Labs Review Labs Reviewed  WET PREP, GENITAL - Abnormal; Notable for the following:    WBC, Wet Prep HPF POC FEW (*)    All other components within normal limits  URINALYSIS, ROUTINE W REFLEX MICROSCOPIC - Abnormal; Notable for the following:    APPearance CLOUDY (*)    Hgb urine dipstick LARGE (*)    Leukocytes, UA MODERATE (*)    All other components within normal limits  CBC WITH DIFFERENTIAL - Abnormal; Notable for the following:    RBC 5.88 (*)    MCV 68.0 (*)    MCH 23.1 (*)    RDW 16.7 (*)    All other components within normal limits  URINE MICROSCOPIC-ADD ON - Abnormal; Notable for the following:    Squamous Epithelial / LPF FEW (*)    Bacteria, UA MANY (*)    All other components within normal limits  GC/CHLAMYDIA PROBE AMP  URINE CULTURE  COMPREHENSIVE METABOLIC PANEL  LIPASE, BLOOD  POC URINE PREG, ED    Imaging Review No results found.   EKG Interpretation None      MDM   Final diagnoses:  UTI (lower urinary tract infection)   Patient is a 28 y.o. Female who presents to the ED with dysuria, abdominal pain, urgency and frequency. UA here shows hematuria, many bacteria, moderate leukocytes, and negative nitrites.  Urine culture is pending.  CBC, CMP, and lipase are without abnormalities.  Urine pregnancy is negative.  Wet prep is unremarkable.  Given symptoms and urine will treat the patient with keflex and pryidium.  Patient was given strict return precautions of pyelonephritis and decreased urine output.  Patient is  stable for discharge at this time.  She states understanding and agreement to the above plan.      Eben Burow, PA-C 05/24/14 1609

## 2014-05-26 LAB — GC/CHLAMYDIA PROBE AMP
CT PROBE, AMP APTIMA: NEGATIVE
GC PROBE AMP APTIMA: NEGATIVE

## 2014-05-26 LAB — URINE CULTURE
Colony Count: >=100000
Special Requests: NORMAL

## 2014-05-27 NOTE — ED Notes (Signed)
Reviewed by Daylene PoseySanson Lee, RPH, (+) urine culture, currently on Cephalexin

## 2014-07-15 ENCOUNTER — Ambulatory Visit (INDEPENDENT_AMBULATORY_CARE_PROVIDER_SITE_OTHER): Payer: BC Managed Care – PPO | Admitting: Family Medicine

## 2014-07-15 VITALS — BP 130/74 | HR 97 | Temp 98.6°F | Resp 16 | Ht 69.0 in | Wt 270.6 lb

## 2014-07-15 DIAGNOSIS — N760 Acute vaginitis: Secondary | ICD-10-CM

## 2014-07-15 DIAGNOSIS — A499 Bacterial infection, unspecified: Secondary | ICD-10-CM

## 2014-07-15 DIAGNOSIS — N898 Other specified noninflammatory disorders of vagina: Secondary | ICD-10-CM

## 2014-07-15 DIAGNOSIS — B9689 Other specified bacterial agents as the cause of diseases classified elsewhere: Secondary | ICD-10-CM

## 2014-07-15 DIAGNOSIS — R3915 Urgency of urination: Secondary | ICD-10-CM

## 2014-07-15 DIAGNOSIS — N390 Urinary tract infection, site not specified: Secondary | ICD-10-CM

## 2014-07-15 LAB — POCT URINALYSIS DIPSTICK
Bilirubin, UA: NEGATIVE
Glucose, UA: NEGATIVE
Ketones, UA: NEGATIVE
NITRITE UA: NEGATIVE
Protein, UA: NEGATIVE
Spec Grav, UA: 1.02
UROBILINOGEN UA: 1
pH, UA: 6.5

## 2014-07-15 LAB — POCT WET PREP WITH KOH
Clue Cells Wet Prep HPF POC: 75
KOH Prep POC: NEGATIVE
Trichomonas, UA: NEGATIVE
Yeast Wet Prep HPF POC: NEGATIVE

## 2014-07-15 LAB — POCT UA - MICROSCOPIC ONLY
CRYSTALS, UR, HPF, POC: NEGATIVE
Casts, Ur, LPF, POC: NEGATIVE
Mucus, UA: POSITIVE
Yeast, UA: NEGATIVE

## 2014-07-15 MED ORDER — METRONIDAZOLE 500 MG PO TABS: 500.0000 mg | ORAL_TABLET | Freq: Two times a day (BID) | ORAL | Status: DC

## 2014-07-15 MED ORDER — SULFAMETHOXAZOLE-TMP DS 800-160 MG PO TABS: 1.0000 | ORAL_TABLET | Freq: Two times a day (BID) | ORAL | Status: DC

## 2014-07-15 MED ORDER — FLUCONAZOLE 150 MG PO TABS: 150.0000 mg | ORAL_TABLET | Freq: Once | ORAL | Status: DC

## 2014-07-15 NOTE — Progress Notes (Signed)
Subjective: 28 year old lady who had a urinary tract infection a couple of weeks ago when she was in [place], treated with 3 days of Cipro, and seem to resolve. Then developed itching treated himself with some over-the-counter anti-monilial medication. She has continued to have a recurrence of dysuria and some itching. It been a long time since she had problems with a UTI. A decade ago she had a lot of trouble in the going to a urologist and vomiting cleared up. Has not had trouble since.  Objective: Moderately overweight pleasant lady alert and oriented in no acute distress. Abd. Soft. Normal external genitalia. There is a whitish discharge around the introitus. She has some discharge up inside her. Vaginal mucosa does not look inflamed. This wet prep was taken. Gen-Probe was taken.    Results for orders placed in visit on 07/15/14  POCT URINALYSIS DIPSTICK      Result Value Ref Range   Color, UA dark yellow     Clarity, UA hazy     Glucose, UA neg     Bilirubin, UA neg     Ketones, UA neg     Spec Grav, UA 1.020     Blood, UA trace-lysed     pH, UA 6.5     Protein, UA neg     Urobilinogen, UA 1.0     Nitrite, UA neg     Leukocytes, UA small (1+)    POCT UA - MICROSCOPIC ONLY      Result Value Ref Range   WBC, Ur, HPF, POC 2-9     RBC, urine, microscopic 3-5     Bacteria, U Microscopic 1+     Mucus, UA positive     Epithelial cells, urine per micros 4-10     Crystals, Ur, HPF, POC neg     Casts, Ur, LPF, POC neg     Yeast, UA neg    POCT WET PREP WITH KOH      Result Value Ref Range   Trichomonas, UA Negative     Clue Cells Wet Prep HPF POC 75%     Epithelial Wet Prep HPF POC 20-25     Yeast Wet Prep HPF POC neg     Bacteria Wet Prep HPF POC 2+     RBC Wet Prep HPF POC 5-10     WBC Wet Prep HPF POC 4-5     KOH Prep POC Negative      Assessment: UTI Bacterial vaginosis  Plan: Culture Gen-Probe Metronidazole Diflucan Bactrim

## 2014-07-15 NOTE — Patient Instructions (Addendum)
Drink plenty of fluids  Take the sulfamethoxazole one twice daily for infection  Take the metronidazole one twice daily for bacterial vaginosis.  If you develop yeast like itching getting worse go ahead and use the Diflucan one pill one time

## 2014-07-16 LAB — GC/CHLAMYDIA PROBE AMP
CT Probe RNA: NEGATIVE
GC PROBE AMP APTIMA: NEGATIVE

## 2014-07-17 LAB — URINE CULTURE: Colony Count: 65000

## 2014-07-19 ENCOUNTER — Other Ambulatory Visit: Payer: Self-pay | Admitting: Physician Assistant

## 2014-07-19 MED ORDER — NITROFURANTOIN MONOHYD MACRO 100 MG PO CAPS: 100.0000 mg | ORAL_CAPSULE | Freq: Two times a day (BID) | ORAL | Status: AC

## 2014-09-22 ENCOUNTER — Ambulatory Visit (INDEPENDENT_AMBULATORY_CARE_PROVIDER_SITE_OTHER): Payer: BC Managed Care – PPO | Admitting: Internal Medicine

## 2014-09-22 VITALS — BP 116/80 | HR 96 | Temp 98.3°F | Resp 16 | Ht 69.5 in | Wt 278.0 lb

## 2014-09-22 DIAGNOSIS — R350 Frequency of micturition: Secondary | ICD-10-CM

## 2014-09-22 DIAGNOSIS — N3001 Acute cystitis with hematuria: Secondary | ICD-10-CM

## 2014-09-22 LAB — POCT UA - MICROSCOPIC ONLY
CRYSTALS, UR, HPF, POC: NEGATIVE
Casts, Ur, LPF, POC: NEGATIVE
MUCUS UA: NEGATIVE
Yeast, UA: NEGATIVE

## 2014-09-22 LAB — POCT URINALYSIS DIPSTICK
BILIRUBIN UA: NEGATIVE
Glucose, UA: NEGATIVE
KETONES UA: NEGATIVE
NITRITE UA: NEGATIVE
PH UA: 6.5
SPEC GRAV UA: 1.025
Urobilinogen, UA: 4

## 2014-09-22 MED ORDER — CEPHALEXIN 500 MG PO CAPS: 500.0000 mg | ORAL_CAPSULE | Freq: Three times a day (TID) | ORAL | Status: AC

## 2014-09-22 MED ORDER — FLUCONAZOLE 150 MG PO TABS: 150.0000 mg | ORAL_TABLET | Freq: Once | ORAL | Status: DC

## 2014-09-22 NOTE — Progress Notes (Signed)
Subjective:    Patient ID: Michele Warren, female    DOB: [birth year], 28 y.o.   MRN: [id number]  Urinary Frequency  Associated symptoms include frequency. Pertinent negatives include no flank pain, nausea, urgency or vomiting.   Michele Warren is a previously healthy 28yo female presenting with 4 weeks of frequency.  She denies urgency, dysuria, n/v, fever, back pain, and vaginal discharge.  She has woken up once a night to urinate intermittently over the past month.  Despite feeling that she often needs to void, she does not always void a significant amount of urine.  She endorses a h/o recurrent UTIs in her early 6420s.  She states that she went to a urologist, they performed blood work and imaging, and she was given a gold colored tablet to take, after which she did not have any more UTIs until this year.  She is unsure of the medication she took and which urologist she saw, but [place].  She presented with dysuria and was dx with Ecoli UTI 8/16 and 10/7 of this year.  She was treated with keflex for 7 days the first time and bactrim, switched to nitrofurantoin due to resistance on cx, for 7 days.    She has been a monogamous relationship with one female sexual partner for the past 5 yrs.  They do not use spermicide, but did start having anal sex around August, and started trying a new lubricant about a month ago.  She voids every time after intercourse.  She is on OCPs for birth control.  No known medical conditions and no medications Allergies  Allergen Reactions  . Hydrocodone    Review of Systems  Constitutional: Negative for fever and appetite change.  Gastrointestinal: Negative for nausea, vomiting and abdominal pain.  Genitourinary: Positive for frequency. Negative for dysuria, urgency, flank pain, vaginal discharge and difficulty urinating.  Skin: Negative for rash.      Objective:   Physical Exam  Constitutional: She is oriented to person, place, and time. She  appears well-developed and well-nourished. No distress.  HENT:  Head: Normocephalic and atraumatic.  Mouth/Throat: Oropharynx is clear and moist.  Cardiovascular: Normal rate, regular rhythm and normal heart sounds.   No murmur heard. Pulmonary/Chest: Effort normal and breath sounds normal. No respiratory distress. She has no wheezes. She has no rales.  Abdominal: Soft. Bowel sounds are normal.  No CVA tenderness  Neurological: She is alert and oriented to person, place, and time.  Skin: Skin is warm and dry. No rash noted.   Urine dipstick shows positive for leukocytes (1+) and negative for nitirites.  Micro exam: 15-20 WBC's per HPF, 5-15 RBC's per HPF, 2+ bacteria, no crystals seen and no casts seen.     Assessment & Plan:   Urinary frequency, concern for recurrent UTI -Recurrent UTIs in early 20s with urology w/u without known cause per patient and no recurrence from age 28 to now after taking a medication.  Has now had cx proven UTI 8/15 and 10/15. -Given month of frequency with intermittent nocturia and no urgency or dysuria, small bacteria and WBC on UA, frequency may not be infectious.  Will send for culture and if negative, pt needs further w/u for cause of frequency -Will tx with keflex as both prior Ecoli UTIs were sensitive to this antibiotic -If true UTI, 3rd occurrence in 6mos and will likely Rx prophylactic abx  -Discussed behavioral changes to minimize future UTIs   Dr Brandon MelnickSwerlick assisted in this  evaluation 

## 2014-09-26 LAB — URINE CULTURE: Colony Count: >=100000

## 2014-10-12 ENCOUNTER — Telehealth: Payer: Self-pay

## 2014-10-12 NOTE — Telephone Encounter (Signed)
Pt has a history of recurrent UTI's- Pt does not want to come into clinic unless necessary.  Last OV does have history of UTI's. Pt has been to a urologist in the past.  Please advise.

## 2014-10-12 NOTE — Telephone Encounter (Signed)
LM for pt to RTC to be reevaluated.

## 2014-10-12 NOTE — Telephone Encounter (Signed)
Due to her frequent recurrence, and the sensitivities on the last culture, advise that she RTC for repeat evaluation.

## 2014-10-12 NOTE — Telephone Encounter (Signed)
Requesting more antibiotics for recurring UTI.    [id number]

## 2014-10-16 ENCOUNTER — Ambulatory Visit (INDEPENDENT_AMBULATORY_CARE_PROVIDER_SITE_OTHER): Payer: BLUE CROSS/BLUE SHIELD | Admitting: Family Medicine

## 2014-10-16 VITALS — BP 122/80 | HR 80 | Temp 98.1°F | Resp 18 | Ht 69.25 in | Wt 267.2 lb

## 2014-10-16 DIAGNOSIS — Z87440 Personal history of urinary (tract) infections: Secondary | ICD-10-CM

## 2014-10-16 DIAGNOSIS — N3090 Cystitis, unspecified without hematuria: Secondary | ICD-10-CM

## 2014-10-16 DIAGNOSIS — R309 Painful micturition, unspecified: Secondary | ICD-10-CM

## 2014-10-16 LAB — POCT UA - MICROSCOPIC ONLY
Casts, Ur, LPF, POC: NEGATIVE
Crystals, Ur, HPF, POC: NEGATIVE
Mucus, UA: NEGATIVE
YEAST UA: NEGATIVE

## 2014-10-16 LAB — POCT URINALYSIS DIPSTICK
Bilirubin, UA: NEGATIVE
Glucose, UA: NEGATIVE
Ketones, UA: 15
NITRITE UA: NEGATIVE
Protein, UA: NEGATIVE
Urobilinogen, UA: 0.2
pH, UA: 6

## 2014-10-16 MED ORDER — NITROFURANTOIN MONOHYD MACRO 100 MG PO CAPS: 100.0000 mg | ORAL_CAPSULE | Freq: Two times a day (BID) | ORAL | Status: DC

## 2014-10-16 MED ORDER — TRIMETHOPRIM 100 MG PO TABS: 100.0000 mg | ORAL_TABLET | Freq: Two times a day (BID) | ORAL | Status: DC

## 2014-10-16 NOTE — Progress Notes (Signed)
Subjective: 29 year old lady who seems to be having a problem with recurrent UTIs. She feels like she does everything right with her urinary habits and sexual habits. We have discussed that again. She was treated about 4 times during the last year for UTIs, 2 times recently, and was told she might need some prophylactic treatment. She again has gotten infected with dysuria the last 3 days or so. No fever or chills  Objective: No CVA tenderness. Abdomen soft without mass or tenderness.  Assessment: Recurrent urinary tract infections Acute UTI  Plan:  Nitrofurantoin twice a day for 10 days Immediately following that with taking trimethoprim 1 daily. Urinalysis upon finishing the course of nitrofurantoin   Results for orders placed or performed in visit on 10/16/14  POCT urinalysis dipstick  Result Value Ref Range   Color, UA yellow    Clarity, UA cloudy    Glucose, UA neg    Bilirubin, UA neg    Ketones, UA 15    Spec Grav, UA <=1.005    Blood, UA small    pH, UA 6.0    Protein, UA neg    Urobilinogen, UA 0.2    Nitrite, UA neg    Leukocytes, UA small (1+)   POCT UA - Microscopic Only  Result Value Ref Range   WBC, Ur, HPF, POC 1-3    RBC, urine, microscopic 1-3    Bacteria, U Microscopic trace    Mucus, UA neg    Epithelial cells, urine per micros 0-3    Crystals, Ur, HPF, POC neg    Casts, Ur, LPF, POC neg    Yeast, UA neg    She does have some white cells on the dip though not many seen under the microscope. This is what happened last time she had a clearly positive culture. I think we will still pursue the same course as discussed.

## 2014-10-16 NOTE — Patient Instructions (Signed)
Drink plenty of fluids Take the nitrofurantoin one twice daily for 10 days 3 or 4 days after finishing the antibiotic please return for urinalysis Upon finishing the nitrofurantoin begin trimethoprim 1 daily Return at anytime if you think you're having acute symptoms.

## 2014-12-28 ENCOUNTER — Other Ambulatory Visit: Payer: Self-pay | Admitting: Obstetrics and Gynecology

## 2014-12-29 LAB — CYTOLOGY - PAP

## 2016-12-28 LAB — OB RESULTS CONSOLE ABO/RH: RH Type: POSITIVE

## 2016-12-28 LAB — OB RESULTS CONSOLE HIV ANTIBODY (ROUTINE TESTING): HIV: NONREACTIVE

## 2016-12-28 LAB — OB RESULTS CONSOLE ANTIBODY SCREEN: Antibody Screen: NEGATIVE

## 2016-12-28 LAB — OB RESULTS CONSOLE GC/CHLAMYDIA
Chlamydia: NEGATIVE
GC PROBE AMP, GENITAL: NEGATIVE

## 2016-12-28 LAB — OB RESULTS CONSOLE RPR: RPR: NONREACTIVE

## 2016-12-28 LAB — OB RESULTS CONSOLE HEPATITIS B SURFACE ANTIGEN: Hepatitis B Surface Ag: NEGATIVE

## 2016-12-28 LAB — OB RESULTS CONSOLE RUBELLA ANTIBODY, IGM: Rubella: IMMUNE

## 2016-12-28 LAB — OB RESULTS CONSOLE GBS: GBS: POSITIVE

## 2017-08-08 ENCOUNTER — Telehealth (HOSPITAL_COMMUNITY): Payer: Self-pay | Admitting: *Deleted

## 2017-08-08 ENCOUNTER — Encounter (HOSPITAL_COMMUNITY): Payer: Self-pay | Admitting: *Deleted

## 2017-08-08 NOTE — Telephone Encounter (Signed)
Preadmission screen  

## 2017-08-15 ENCOUNTER — Inpatient Hospital Stay (HOSPITAL_COMMUNITY): Payer: Managed Care, Other (non HMO) | Admitting: Anesthesiology

## 2017-08-15 ENCOUNTER — Inpatient Hospital Stay (HOSPITAL_COMMUNITY)
Admission: RE | Admit: 2017-08-15 | Discharge: 2017-08-17 | DRG: 788 | Disposition: A | Discharge status: Home / Self Care | Payer: Managed Care, Other (non HMO) | Source: Ambulatory Visit | Attending: Obstetrics & Gynecology | Admitting: Obstetrics & Gynecology

## 2017-08-15 ENCOUNTER — Encounter (HOSPITAL_COMMUNITY)
Admission: RE | Disposition: A | Discharge status: Home / Self Care | Payer: Self-pay | Source: Ambulatory Visit | Attending: Obstetrics & Gynecology

## 2017-08-15 ENCOUNTER — Encounter (HOSPITAL_COMMUNITY): Payer: Self-pay

## 2017-08-15 DIAGNOSIS — O99214 Obesity complicating childbirth: Secondary | ICD-10-CM | POA: Diagnosis present

## 2017-08-15 DIAGNOSIS — Z3A40 40 weeks gestation of pregnancy: Secondary | ICD-10-CM

## 2017-08-15 DIAGNOSIS — D649 Anemia, unspecified: Secondary | ICD-10-CM | POA: Diagnosis present

## 2017-08-15 DIAGNOSIS — O99824 Streptococcus B carrier state complicating childbirth: Secondary | ICD-10-CM | POA: Diagnosis present

## 2017-08-15 DIAGNOSIS — Z98891 History of uterine scar from previous surgery: Secondary | ICD-10-CM

## 2017-08-15 DIAGNOSIS — O480 Post-term pregnancy: Principal | ICD-10-CM | POA: Diagnosis present

## 2017-08-15 DIAGNOSIS — O9902 Anemia complicating childbirth: Secondary | ICD-10-CM | POA: Diagnosis present

## 2017-08-15 DIAGNOSIS — Z3490 Encounter for supervision of normal pregnancy, unspecified, unspecified trimester: Secondary | ICD-10-CM

## 2017-08-15 DIAGNOSIS — I959 Hypotension, unspecified: Secondary | ICD-10-CM | POA: Diagnosis not present

## 2017-08-15 LAB — RPR: RPR: NONREACTIVE

## 2017-08-15 LAB — CBC
HCT: 37.5 % (ref 36.0–46.0)
HEMOGLOBIN: 13.2 g/dL (ref 12.0–15.0)
MCH: 22.9 pg — ABNORMAL LOW (ref 26.0–34.0)
MCHC: 35.2 g/dL (ref 30.0–36.0)
MCV: 65.1 fL — ABNORMAL LOW (ref 78.0–100.0)
PLATELETS: 232 10*3/uL (ref 150–400)
RBC: 5.76 MIL/uL — AB (ref 3.87–5.11)
RDW: 18 % — ABNORMAL HIGH (ref 11.5–15.5)
WBC: 14.7 10*3/uL — AB (ref 4.0–10.5)

## 2017-08-15 LAB — TYPE AND SCREEN
ABO/RH(D): B POS
ANTIBODY SCREEN: NEGATIVE

## 2017-08-15 LAB — ABO/RH: ABO/RH(D): B POS

## 2017-08-15 SURGERY — Surgical Case
Anesthesia: Epidural

## 2017-08-15 MED ORDER — LACTATED RINGERS IV SOLN
INTRAVENOUS | Status: DC
  Administered 2017-08-15: 15:00:00 via INTRAVENOUS

## 2017-08-15 MED ORDER — MEPERIDINE HCL 25 MG/ML IJ SOLN
INTRAMUSCULAR | Status: DC | PRN
  Administered 2017-08-15 (×2): 12.5 mg via INTRAVENOUS

## 2017-08-15 MED ORDER — PHENYLEPHRINE 40 MCG/ML (10ML) SYRINGE FOR IV PUSH (FOR BLOOD PRESSURE SUPPORT)
80.0000 ug | PREFILLED_SYRINGE | INTRAVENOUS | Status: AC | PRN
  Administered 2017-08-15 (×3): 80 ug via INTRAVENOUS
  Filled 2017-08-15: qty 10

## 2017-08-15 MED ORDER — ACETAMINOPHEN 325 MG PO TABS: 650.0000 mg | ORAL_TABLET | ORAL | Status: DC | PRN

## 2017-08-15 MED ORDER — LACTATED RINGERS IV SOLN: 500.0000 mL | Freq: Once | INTRAVENOUS | Status: DC

## 2017-08-15 MED ORDER — WITCH HAZEL-GLYCERIN EX PADS: 1.0000 "application " | MEDICATED_PAD | CUTANEOUS | Status: DC | PRN

## 2017-08-15 MED ORDER — DEXAMETHASONE SODIUM PHOSPHATE 4 MG/ML IJ SOLN
INTRAMUSCULAR | Status: AC
  Filled 2017-08-15: qty 1

## 2017-08-15 MED ORDER — MORPHINE SULFATE (PF) 0.5 MG/ML IJ SOLN
INTRAMUSCULAR | Status: DC | PRN
  Administered 2017-08-15: 4 mg via EPIDURAL

## 2017-08-15 MED ORDER — NALBUPHINE HCL 10 MG/ML IJ SOLN: 5.0000 mg | Freq: Once | INTRAMUSCULAR | Status: DC | PRN

## 2017-08-15 MED ORDER — SCOPOLAMINE 1 MG/3DAYS TD PT72: 1.0000 | MEDICATED_PATCH | Freq: Once | TRANSDERMAL | Status: DC

## 2017-08-15 MED ORDER — FENTANYL CITRATE (PF) 100 MCG/2ML IJ SOLN
50.0000 ug | INTRAMUSCULAR | Status: DC | PRN
  Administered 2017-08-15 (×2): 100 ug via INTRAVENOUS
  Filled 2017-08-15 (×2): qty 2

## 2017-08-15 MED ORDER — FENTANYL 2.5 MCG/ML BUPIVACAINE 1/10 % EPIDURAL INFUSION (WH - ANES)
14.0000 mL/h | INTRAMUSCULAR | Status: DC | PRN
  Administered 2017-08-15: 14 mL/h via EPIDURAL
  Filled 2017-08-15: qty 100

## 2017-08-15 MED ORDER — SODIUM CHLORIDE 0.9% FLUSH: 3.0000 mL | INTRAVENOUS | Status: DC | PRN

## 2017-08-15 MED ORDER — SCOPOLAMINE 1 MG/3DAYS TD PT72
MEDICATED_PATCH | TRANSDERMAL | Status: DC | PRN
  Administered 2017-08-15: 1 via TRANSDERMAL

## 2017-08-15 MED ORDER — MORPHINE SULFATE (PF) 0.5 MG/ML IJ SOLN
INTRAMUSCULAR | Status: AC
  Filled 2017-08-15: qty 10

## 2017-08-15 MED ORDER — LIDOCAINE-EPINEPHRINE (PF) 2 %-1:200000 IJ SOLN
INTRAMUSCULAR | Status: AC
  Filled 2017-08-15: qty 20

## 2017-08-15 MED ORDER — OXYTOCIN 40 UNITS IN LACTATED RINGERS INFUSION - SIMPLE MED: 2.5000 [IU]/h | INTRAVENOUS | Status: DC

## 2017-08-15 MED ORDER — SIMETHICONE 80 MG PO CHEW
80.0000 mg | CHEWABLE_TABLET | Freq: Three times a day (TID) | ORAL | Status: DC
  Administered 2017-08-16 – 2017-08-17 (×4): 80 mg via ORAL
  Filled 2017-08-15 (×4): qty 1

## 2017-08-15 MED ORDER — DIPHENHYDRAMINE HCL 50 MG/ML IJ SOLN: 12.5000 mg | INTRAMUSCULAR | Status: DC | PRN

## 2017-08-15 MED ORDER — MENTHOL 3 MG MT LOZG: 1.0000 | LOZENGE | OROMUCOSAL | Status: DC | PRN

## 2017-08-15 MED ORDER — PENICILLIN G POT IN DEXTROSE 60000 UNIT/ML IV SOLN
3.0000 10*6.[IU] | INTRAVENOUS | Status: DC
  Filled 2017-08-15 (×4): qty 50

## 2017-08-15 MED ORDER — CEFAZOLIN SODIUM 10 G IJ SOLR
INTRAMUSCULAR | Status: DC | PRN
  Administered 2017-08-15: 3 g via INTRAVENOUS

## 2017-08-15 MED ORDER — NALOXONE HCL 2 MG/2ML IJ SOSY: 1.0000 ug/kg/h | PREFILLED_SYRINGE | INTRAMUSCULAR | Status: DC | PRN

## 2017-08-15 MED ORDER — NALBUPHINE HCL 10 MG/ML IJ SOLN: 5.0000 mg | INTRAMUSCULAR | Status: DC | PRN

## 2017-08-15 MED ORDER — DIPHENHYDRAMINE HCL 25 MG PO CAPS: 25.0000 mg | ORAL_CAPSULE | ORAL | Status: DC | PRN

## 2017-08-15 MED ORDER — LIDOCAINE-EPINEPHRINE (PF) 2 %-1:200000 IJ SOLN
INTRAMUSCULAR | Status: DC | PRN
  Administered 2017-08-15: 7 mL via EPIDURAL

## 2017-08-15 MED ORDER — OXYTOCIN 10 UNIT/ML IJ SOLN
INTRAMUSCULAR | Status: AC
  Filled 2017-08-15: qty 4

## 2017-08-15 MED ORDER — ONDANSETRON HCL 4 MG/2ML IJ SOLN: 4.0000 mg | Freq: Four times a day (QID) | INTRAMUSCULAR | Status: DC | PRN

## 2017-08-15 MED ORDER — SIMETHICONE 80 MG PO CHEW: 80.0000 mg | CHEWABLE_TABLET | ORAL | Status: DC | PRN

## 2017-08-15 MED ORDER — MEPERIDINE HCL 25 MG/ML IJ SOLN: 6.2500 mg | INTRAMUSCULAR | Status: DC | PRN

## 2017-08-15 MED ORDER — LACTATED RINGERS IV SOLN
INTRAVENOUS | Status: DC
  Administered 2017-08-15 – 2017-08-16 (×2): via INTRAVENOUS

## 2017-08-15 MED ORDER — OXYTOCIN 40 UNITS IN LACTATED RINGERS INFUSION - SIMPLE MED: 2.5000 [IU]/h | INTRAVENOUS | Status: AC

## 2017-08-15 MED ORDER — EPHEDRINE 5 MG/ML INJ
10.0000 mg | INTRAVENOUS | Status: DC | PRN
  Administered 2017-08-15: 10 mg via INTRAVENOUS

## 2017-08-15 MED ORDER — SIMETHICONE 80 MG PO CHEW
80.0000 mg | CHEWABLE_TABLET | ORAL | Status: DC
  Administered 2017-08-15 – 2017-08-16 (×2): 80 mg via ORAL
  Filled 2017-08-15 (×2): qty 1

## 2017-08-15 MED ORDER — SENNOSIDES-DOCUSATE SODIUM 8.6-50 MG PO TABS
2.0000 | ORAL_TABLET | ORAL | Status: DC
  Administered 2017-08-15 – 2017-08-16 (×2): 2 via ORAL
  Filled 2017-08-15 (×2): qty 2

## 2017-08-15 MED ORDER — PHENYLEPHRINE 40 MCG/ML (10ML) SYRINGE FOR IV PUSH (FOR BLOOD PRESSURE SUPPORT)
PREFILLED_SYRINGE | INTRAVENOUS | Status: AC
  Filled 2017-08-15: qty 10

## 2017-08-15 MED ORDER — PHENYLEPHRINE HCL 10 MG/ML IJ SOLN
INTRAMUSCULAR | Status: DC | PRN
  Administered 2017-08-15 (×5): 80 ug via INTRAVENOUS

## 2017-08-15 MED ORDER — HYDROMORPHONE HCL 1 MG/ML IJ SOLN: 0.2500 mg | INTRAMUSCULAR | Status: DC | PRN

## 2017-08-15 MED ORDER — LACTATED RINGERS IV SOLN
INTRAVENOUS | Status: DC | PRN
  Administered 2017-08-15: 40 [IU] via INTRAVENOUS

## 2017-08-15 MED ORDER — CEFAZOLIN SODIUM-DEXTROSE 2-4 GM/100ML-% IV SOLN: 2.0000 g | Freq: Once | INTRAVENOUS | Status: DC

## 2017-08-15 MED ORDER — PHENYLEPHRINE 40 MCG/ML (10ML) SYRINGE FOR IV PUSH (FOR BLOOD PRESSURE SUPPORT)
80.0000 ug | PREFILLED_SYRINGE | INTRAVENOUS | Status: DC | PRN
  Filled 2017-08-15: qty 10

## 2017-08-15 MED ORDER — ONDANSETRON HCL 4 MG/2ML IJ SOLN
INTRAMUSCULAR | Status: DC | PRN
  Administered 2017-08-15: 4 mg via INTRAVENOUS

## 2017-08-15 MED ORDER — METOCLOPRAMIDE HCL 5 MG/ML IJ SOLN
INTRAMUSCULAR | Status: AC
  Filled 2017-08-15: qty 2

## 2017-08-15 MED ORDER — OXYTOCIN 40 UNITS IN LACTATED RINGERS INFUSION - SIMPLE MED
1.0000 m[IU]/min | INTRAVENOUS | Status: DC
  Administered 2017-08-15: 2 m[IU]/min via INTRAVENOUS
  Filled 2017-08-15: qty 1000

## 2017-08-15 MED ORDER — MEPERIDINE HCL 25 MG/ML IJ SOLN
INTRAMUSCULAR | Status: AC
  Filled 2017-08-15: qty 1

## 2017-08-15 MED ORDER — LIDOCAINE HCL (PF) 1 % IJ SOLN: 30.0000 mL | INTRAMUSCULAR | Status: DC | PRN

## 2017-08-15 MED ORDER — DIBUCAINE 1 % RE OINT: 1.0000 "application " | TOPICAL_OINTMENT | RECTAL | Status: DC | PRN

## 2017-08-15 MED ORDER — PRENATAL MULTIVITAMIN CH
1.0000 | ORAL_TABLET | Freq: Every day | ORAL | Status: DC
  Administered 2017-08-16: 1 via ORAL
  Filled 2017-08-15: qty 1

## 2017-08-15 MED ORDER — NALOXONE HCL 0.4 MG/ML IJ SOLN: 0.4000 mg | INTRAMUSCULAR | Status: DC | PRN

## 2017-08-15 MED ORDER — ACETAMINOPHEN 325 MG PO TABS
650.0000 mg | ORAL_TABLET | ORAL | Status: DC | PRN
  Administered 2017-08-16 (×2): 650 mg via ORAL
  Filled 2017-08-15 (×3): qty 2

## 2017-08-15 MED ORDER — PROMETHAZINE HCL 25 MG/ML IJ SOLN: 6.2500 mg | INTRAMUSCULAR | Status: DC | PRN

## 2017-08-15 MED ORDER — TETANUS-DIPHTH-ACELL PERTUSSIS 5-2.5-18.5 LF-MCG/0.5 IM SUSP: 0.5000 mL | Freq: Once | INTRAMUSCULAR | Status: DC

## 2017-08-15 MED ORDER — LIDOCAINE HCL (PF) 1 % IJ SOLN
INTRAMUSCULAR | Status: DC | PRN
  Administered 2017-08-15 (×2): 5 mL via EPIDURAL

## 2017-08-15 MED ORDER — LACTATED RINGERS IV SOLN
INTRAVENOUS | Status: DC | PRN
  Administered 2017-08-15 (×3): via INTRAVENOUS

## 2017-08-15 MED ORDER — DEXAMETHASONE SODIUM PHOSPHATE 4 MG/ML IJ SOLN
INTRAMUSCULAR | Status: DC | PRN
  Administered 2017-08-15: 4 mg via INTRAVENOUS

## 2017-08-15 MED ORDER — PENICILLIN G POTASSIUM 5000000 UNITS IJ SOLR
5.0000 10*6.[IU] | Freq: Once | INTRAVENOUS | Status: AC
  Administered 2017-08-15: 5 10*6.[IU] via INTRAVENOUS
  Filled 2017-08-15: qty 5

## 2017-08-15 MED ORDER — SCOPOLAMINE 1 MG/3DAYS TD PT72
MEDICATED_PATCH | TRANSDERMAL | Status: AC
  Filled 2017-08-15: qty 1

## 2017-08-15 MED ORDER — LACTATED RINGERS IV SOLN: 500.0000 mL | INTRAVENOUS | Status: DC | PRN

## 2017-08-15 MED ORDER — TERBUTALINE SULFATE 1 MG/ML IJ SOLN: 0.2500 mg | Freq: Once | INTRAMUSCULAR | Status: DC | PRN

## 2017-08-15 MED ORDER — ZOLPIDEM TARTRATE 5 MG PO TABS: 5.0000 mg | ORAL_TABLET | Freq: Every evening | ORAL | Status: DC | PRN

## 2017-08-15 MED ORDER — SODIUM CHLORIDE 0.9 % IR SOLN
Status: DC | PRN
  Administered 2017-08-15: 1000 mL

## 2017-08-15 MED ORDER — METOCLOPRAMIDE HCL 5 MG/ML IJ SOLN
INTRAMUSCULAR | Status: DC | PRN
  Administered 2017-08-15: 10 mg via INTRAVENOUS

## 2017-08-15 MED ORDER — EPHEDRINE 5 MG/ML INJ
10.0000 mg | INTRAVENOUS | Status: DC | PRN
  Filled 2017-08-15: qty 4

## 2017-08-15 MED ORDER — SOD CITRATE-CITRIC ACID 500-334 MG/5ML PO SOLN
30.0000 mL | ORAL | Status: DC | PRN
  Administered 2017-08-15: 30 mL via ORAL
  Filled 2017-08-15: qty 15

## 2017-08-15 MED ORDER — ONDANSETRON HCL 4 MG/2ML IJ SOLN
INTRAMUSCULAR | Status: AC
  Filled 2017-08-15: qty 2

## 2017-08-15 MED ORDER — ONDANSETRON HCL 4 MG/2ML IJ SOLN: 4.0000 mg | Freq: Three times a day (TID) | INTRAMUSCULAR | Status: DC | PRN

## 2017-08-15 MED ORDER — DEXTROSE 5 % IV SOLN
INTRAVENOUS | Status: AC
  Filled 2017-08-15: qty 3000

## 2017-08-15 MED ORDER — IBUPROFEN 600 MG PO TABS
600.0000 mg | ORAL_TABLET | Freq: Four times a day (QID) | ORAL | Status: DC
  Administered 2017-08-15 – 2017-08-17 (×6): 600 mg via ORAL
  Filled 2017-08-15 (×6): qty 1

## 2017-08-15 MED ORDER — DIPHENHYDRAMINE HCL 25 MG PO CAPS: 25.0000 mg | ORAL_CAPSULE | Freq: Four times a day (QID) | ORAL | Status: DC | PRN

## 2017-08-15 MED ORDER — OXYTOCIN BOLUS FROM INFUSION: 500.0000 mL | Freq: Once | INTRAVENOUS | Status: DC

## 2017-08-15 MED ORDER — COCONUT OIL OIL: 1.0000 "application " | TOPICAL_OIL | Status: DC | PRN

## 2017-08-15 MED ORDER — FLEET ENEMA 7-19 GM/118ML RE ENEM: 1.0000 | ENEMA | RECTAL | Status: DC | PRN

## 2017-08-15 SURGICAL SUPPLY — 37 items
ADH SKN CLS APL DERMABOND .7 (GAUZE/BANDAGES/DRESSINGS)
APL SKNCLS STERI-STRIP NONHPOA (GAUZE/BANDAGES/DRESSINGS) ×1
BENZOIN TINCTURE PRP APPL 2/3 (GAUZE/BANDAGES/DRESSINGS) ×3 IMPLANT
CHLORAPREP W/TINT 26ML (MISCELLANEOUS) ×3 IMPLANT
CLAMP CORD UMBIL (MISCELLANEOUS) IMPLANT
CLOSURE WOUND 1/2 X4 (GAUZE/BANDAGES/DRESSINGS) ×1
CLOTH BEACON ORANGE TIMEOUT ST (SAFETY) ×3 IMPLANT
DERMABOND ADVANCED (GAUZE/BANDAGES/DRESSINGS)
DERMABOND ADVANCED .7 DNX12 (GAUZE/BANDAGES/DRESSINGS) IMPLANT
DRSG OPSITE POSTOP 4X10 (GAUZE/BANDAGES/DRESSINGS) ×3 IMPLANT
ELECT REM PT RETURN 9FT ADLT (ELECTROSURGICAL) ×3
ELECTRODE REM PT RTRN 9FT ADLT (ELECTROSURGICAL) ×1 IMPLANT
EXTRACTOR VACUUM KIWI (MISCELLANEOUS) IMPLANT
GLOVE BIO SURGEON STRL SZ 6 (GLOVE) ×3 IMPLANT
GLOVE BIOGEL PI IND STRL 6 (GLOVE) ×2 IMPLANT
GLOVE BIOGEL PI IND STRL 7.0 (GLOVE) ×1 IMPLANT
GLOVE BIOGEL PI INDICATOR 6 (GLOVE) ×4
GLOVE BIOGEL PI INDICATOR 7.0 (GLOVE) ×2
GOWN STRL REUS W/TWL LRG LVL3 (GOWN DISPOSABLE) ×6 IMPLANT
KIT ABG SYR 3ML LUER SLIP (SYRINGE) ×3 IMPLANT
NDL HYPO 25X5/8 SAFETYGLIDE (NEEDLE) ×1 IMPLANT
NEEDLE HYPO 25X5/8 SAFETYGLIDE (NEEDLE) ×3 IMPLANT
NS IRRIG 1000ML POUR BTL (IV SOLUTION) ×3 IMPLANT
PACK C SECTION WH (CUSTOM PROCEDURE TRAY) ×3 IMPLANT
PAD OB MATERNITY 4.3X12.25 (PERSONAL CARE ITEMS) ×3 IMPLANT
PENCIL SMOKE EVAC W/HOLSTER (ELECTROSURGICAL) ×3 IMPLANT
STRIP CLOSURE SKIN 1/2X4 (GAUZE/BANDAGES/DRESSINGS) ×1 IMPLANT
SUT CHROMIC 0 CTX 36 (SUTURE) ×9 IMPLANT
SUT MON AB 2-0 CT1 27 (SUTURE) ×3 IMPLANT
SUT PDS AB 0 CT1 27 (SUTURE) IMPLANT
SUT PLAIN 0 NONE (SUTURE) IMPLANT
SUT PLAIN 2 0 (SUTURE) ×3
SUT PLAIN ABS 2-0 CT1 27XMFL (SUTURE) IMPLANT
SUT VIC AB 0 CT1 36 (SUTURE) IMPLANT
SUT VIC AB 4-0 KS 27 (SUTURE) IMPLANT
TOWEL OR 17X24 6PK STRL BLUE (TOWEL DISPOSABLE) ×3 IMPLANT
TRAY FOLEY BAG SILVER LF 14FR (SET/KITS/TRAYS/PACK) IMPLANT

## 2017-08-15 NOTE — Transfer of Care (Signed)
Immediate Anesthesia Transfer of Care Note  Patient: Michele Warren  Procedure(s) Performed: CESAREAN SECTION (N/A )  Patient Location: PACU  Anesthesia Type:Epidural  Level of Consciousness: awake, alert  and oriented  Airway & Oxygen Therapy: Patient Spontanous Breathing  Post-op Assessment: Report given to RN and Post -op Vital signs reviewed and stable  Post vital signs: Reviewed and stable  Last Vitals:  Vitals:   08/15/17 1416 08/15/17 1431  BP: (!) 88/46 107/62  Pulse: (!) 102 90  Resp:    Temp:    SpO2:      Last Pain:  Vitals:   08/15/17 1246  TempSrc:   PainSc: 0-No pain         Complications: No apparent anesthesia complications

## 2017-08-15 NOTE — Addendum Note (Signed)
Addendum  created 08/15/17 1744 by Heather RobertsSinger, Cale Bethard, MD   Sign clinical note

## 2017-08-15 NOTE — Progress Notes (Signed)
After epidural, patient was hypotensive with requirement of ephedrine x 3 and phenylephrine x 1 to restore normotension and Cat I FHT.  Pitocin was d/c'd approximately one hour ago.  SVE 5 cm.  Despite improved BP and and no pitocin, prolonged decel x 6 minutes.  On return to baseline, moderate variability but late decelerations.  Patient was counseled for C/S secondary to fetal intolerance of labor including risk of bleeding, infection, scarring, and damage to surrounding structures.  Mitchel HonourMegan Koreen Lizaola, DO

## 2017-08-15 NOTE — H&P (Signed)
Michele Warren is a 31 y.o. female presenting for post-dates IOL.  Antepartum course uncomplicated.  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  She was found to have Chlamydia at early OB appt with negative TOC 03/12/17.  GBS positive.  OB History    Gravida Para Term Preterm AB Living   1             SAB TAB Ectopic Multiple Live Births                 Past Medical History:  Diagnosis Date  . Allergy   . Anemia   . Headache    Past Surgical History:  Procedure Laterality Date  . CHOLECYSTECTOMY    . DILATION AND CURETTAGE OF UTERUS    . TONSILLECTOMY    . WISDOM TOOTH EXTRACTION     Family History: family history includes Cancer in her maternal grandmother; Diabetes in her paternal aunt and paternal uncle; Hypertension in her maternal grandfather; Pancreatic cancer in her maternal uncle; Prostate cancer in her maternal uncle. Social History:  reports that  has never smoked. she has never used smokeless tobacco. She reports that she does not drink alcohol or use drugs.     Maternal Diabetes: No Genetic Screening: Abnormal:  Results: Other: SMA carrier Maternal Ultrasounds/Referrals: Normal Fetal Ultrasounds or other Referrals:  None Maternal Substance Abuse:  No Significant Maternal Medications:  None Significant Maternal Lab Results:  Lab values include: Group B Strep positive, Other:  Hgb C trait Other Comments:  None  ROS Maternal Medical History:  Fetal activity: Perceived fetal activity is normal.   Last perceived fetal movement was within the past hour.    Prenatal complications: no prenatal complications Prenatal Complications - Diabetes: none.    Dilation: 4 Effacement (%): 50 Station: -2 Exam by:: lee Blood pressure 127/79, pulse (!) 102, temperature 97.6 F (36.4 C), temperature source Oral, resp. rate 16, height 5\' 9"  (1.753 m), weight 278 lb (126.1 kg), last menstrual period 11/03/2016. Maternal Exam:  Uterine Assessment:  Contraction strength is mild.  Contraction frequency is rare.   Abdomen: Patient reports no abdominal tenderness. Fundal height is c/w dates.   Estimated fetal weight is 7#8.       Physical Exam  Constitutional: She is oriented to person, place, and time. She appears well-developed and well-nourished.  GI: Soft. There is no rebound and no guarding.  Neurological: She is alert and oriented to person, place, and time.  Skin: Skin is warm and dry.  Psychiatric: She has a normal mood and affect. Her behavior is normal.    Prenatal labs: ABO, Rh: B/Positive/-- (03/22 0000) Antibody: Negative (03/22 0000) Rubella: Immune (03/22 0000) RPR: Nonreactive (03/22 0000)  HBsAg: Negative (03/22 0000)  HIV: Non-reactive (03/22 0000)  GBS: Positive (03/22 0000)   Assessment/Plan: 31yo G1 at 574w5d for IOL -Start pitocin -AROM when in good CTX pattern -Epidural vs IV pain meds when desired -PCN for GBS ppx -Anticipate NSVD   Michele Warren 08/15/2017, 8:03 AM

## 2017-08-15 NOTE — Anesthesia Procedure Notes (Signed)
Epidural Patient location during procedure: OB Start time: 08/15/2017 12:26 PM End time: 08/15/2017 12:40 PM  Staffing Anesthesiologist: Heather RobertsSinger, Raia Amico, MD Performed: anesthesiologist   Preanesthetic Checklist Completed: patient identified, site marked, pre-op evaluation, timeout performed, IV checked, risks and benefits discussed and monitors and equipment checked  Epidural Patient position: sitting Prep: DuraPrep Patient monitoring: heart rate, cardiac monitor, continuous pulse ox and blood pressure Approach: midline Location: L2-L3 Injection technique: LOR saline  Needle:  Needle type: Tuohy  Needle gauge: 17 G Needle length: 9 cm Needle insertion depth: 7 cm Catheter size: 20 Guage Catheter at skin depth: 12 cm Test dose: negative and Other  Assessment Events: blood not aspirated, injection not painful, no injection resistance and negative IV test  Additional Notes Informed consent obtained prior to proceeding including risk of failure, 1% risk of PDPH, risk of minor discomfort and bruising.  Discussed rare but serious complications including epidural abscess, permanent nerve injury, epidural hematoma.  Discussed alternatives to epidural analgesia and patient desires to proceed.  Timeout performed pre-procedure verifying patient name, procedure, and platelet count.  Patient tolerated procedure well.

## 2017-08-15 NOTE — Anesthesia Preprocedure Evaluation (Signed)
Anesthesia Evaluation  Patient identified by MRN, date of birth, ID band Patient awake    Reviewed: Allergy & Precautions, NPO status , Patient's Chart, lab work & pertinent test results  History of Anesthesia Complications (+) history of anesthetic complications  Airway Mallampati: III  TM Distance: >3 FB     Dental no notable dental hx. (+) Dental Advisory Given   Pulmonary neg pulmonary ROS,    Pulmonary exam normal        Cardiovascular negative cardio ROS Normal cardiovascular exam     Neuro/Psych negative neurological ROS  negative psych ROS   GI/Hepatic negative GI ROS, Neg liver ROS,   Endo/Other  Morbid obesity  Renal/GU negative Renal ROS  negative genitourinary   Musculoskeletal negative musculoskeletal ROS (+)   Abdominal   Peds negative pediatric ROS (+)  Hematology  (+) anemia ,   Anesthesia Other Findings   Reproductive/Obstetrics (+) Pregnancy                             Anesthesia Physical Anesthesia Plan  ASA: III  Anesthesia Plan: Epidural   Post-op Pain Management:    Induction:   PONV Risk Score and Plan: 2  Airway Management Planned: Natural Airway  Additional Equipment:   Intra-op Plan:   Post-operative Plan:   Informed Consent: I have reviewed the patients History and Physical, chart, labs and discussed the procedure including the risks, benefits and alternatives for the proposed anesthesia with the patient or authorized representative who has indicated his/her understanding and acceptance.   Dental advisory given  Plan Discussed with: Anesthesiologist  Anesthesia Plan Comments:         Anesthesia Quick Evaluation

## 2017-08-15 NOTE — Op Note (Signed)
Michele Warren PROCEDURE DATE: 08/15/2017  PREOPERATIVE DIAGNOSIS: Intrauterine pregnancy at  6567w5d weeks gestation, fetal intolerance of labor  POSTOPERATIVE DIAGNOSIS: The same  PROCEDURE:  Primary Low Transverse Cesarean Section  SURGEON:  Dr. Mitchel HonourMegan Kimberl Vig  INDICATIONS: Michele Warren is a 31 y.o. G1P0 at 10367w5d scheduled for cesarean section secondary to fetal intolerance to labor.  The risks of cesarean section discussed with the patient included but were not limited to: bleeding which may require transfusion or reoperation; infection which may require antibiotics; injury to bowel, bladder, ureters or other surrounding organs; injury to the fetus; need for additional procedures including hysterectomy in the event of a life-threatening hemorrhage; placental abnormalities wth subsequent pregnancies, incisional problems, thromboembolic phenomenon and other postoperative/anesthesia complications. The patient concurred with the proposed plan, giving informed written consent for the procedure.    FINDINGS:  Viable female infant in cephalic presentation, APGARs 9,9:  Weight pending  clear amniotic fluid.  Intact placenta, three vessel cord.  Grossly normal uterus, ovaries and fallopian tubes. .   ANESTHESIA:    Epidural ESTIMATED BLOOD LOSS: 600 ml SPECIMENS: Placenta sent to L&D COMPLICATIONS: None immediate  PROCEDURE IN DETAIL:  The patient received intravenous antibiotics and had sequential compression devices applied to her lower extremities while in the preoperative area.  She was then taken to the operating room where epidural anesthesia was dosed up to surgical level and was found to be adequate. She was then placed in a dorsal supine position with a leftward tilt, and prepped and draped in a sterile manner.  A foley catheter was placed into her bladder and attached to constant gravity.  After an adequate timeout was performed, a Pfannenstiel skin incision was made with scalpel and carried  through to the underlying layer of fascia. The fascia was incised in the midline and this incision was extended bilaterally using the Mayo scissors. Kocher clamps were applied to the superior aspect of the fascial incision and the underlying rectus muscles were dissected off bluntly. A similar process was carried out on the inferior aspect of the facial incision. The rectus muscles were separated in the midline bluntly and the peritoneum was entered bluntly.  Bladder flap was created sharply and developed bluntly.  A transverse hysterotomy was made with a scalpel and extended bilaterally bluntly. The bladder blade was then removed. The infant was successfully delivered, and cord was clamped and cut and infant was handed over to awaiting neonatology team. Uterine massage was then administered and the placenta delivered intact with three-vessel cord. The uterus was cleared of clot and debris.  The hysterotomy was closed with 0 chromic.  A second imbricating suture of 0-chromic was used to reinforce the incision and aid in hemostasis.  The peritoneum and rectus muscles were noted to be hemostatic and were reapproximated using 3-0 monocryl in a running fashion.  The fascia was closed with 0-Vicryl in a running fashion with good restoration of anatomy.  The subcutaneus tissue was copiously irrigated and was reapproximated using plain gut suture in three interrupted stitches.  The skin was closed with 4-0 vicryl in a subcuticular fashion.  Pt tolerated the procedure will.  All counts were correct x2.  Pt went to the recovery room in stable condition.

## 2017-08-15 NOTE — Anesthesia Postprocedure Evaluation (Addendum)
Anesthesia Post Note  Patient: Analy N Thiam  Procedure(s) Performed: CESAREAN SECTION (N/A )     Patient location during evaluation: PACU Anesthesia Type: Epidural Level of consciousness: awake and alert Pain management: pain level controlled Vital Signs Assessment: post-procedure vital signs reviewed and stable Respiratory status: spontaneous breathing and respiratory function stable Cardiovascular status: blood pressure returned to baseline and stable Postop Assessment: spinal receding Anesthetic complications: no    Last Vitals:  Vitals:   08/15/17 1715 08/15/17 1736  BP: 116/78 133/74  Pulse: 92 86  Resp: 15 18  Temp: 36.6 C 36.8 C  SpO2: 97% 100%    Last Pain:  Vitals:   08/15/17 1736  TempSrc: Axillary  PainSc: 0-No pain   Pain Goal:                 Doyt Castellana DANIEL

## 2017-08-16 ENCOUNTER — Encounter (HOSPITAL_COMMUNITY): Payer: Self-pay | Admitting: Obstetrics & Gynecology

## 2017-08-16 LAB — CBC
HEMATOCRIT: 32.1 % — AB (ref 36.0–46.0)
HEMOGLOBIN: 11.3 g/dL — AB (ref 12.0–15.0)
MCH: 23 pg — AB (ref 26.0–34.0)
MCHC: 35.2 g/dL (ref 30.0–36.0)
MCV: 65.2 fL — AB (ref 78.0–100.0)
Platelets: 222 10*3/uL (ref 150–400)
RBC: 4.92 MIL/uL (ref 3.87–5.11)
RDW: 17.9 % — ABNORMAL HIGH (ref 11.5–15.5)
WBC: 16.9 10*3/uL — ABNORMAL HIGH (ref 4.0–10.5)

## 2017-08-16 MED ORDER — NALBUPHINE HCL 10 MG/ML IJ SOLN: 5.0000 mg | INTRAMUSCULAR | Status: DC | PRN

## 2017-08-16 MED ORDER — DIPHENHYDRAMINE HCL 50 MG/ML IJ SOLN: 12.5000 mg | INTRAMUSCULAR | Status: DC | PRN

## 2017-08-16 MED ORDER — ONDANSETRON HCL 4 MG/2ML IJ SOLN: 4.0000 mg | Freq: Three times a day (TID) | INTRAMUSCULAR | Status: DC | PRN

## 2017-08-16 MED ORDER — NALBUPHINE HCL 10 MG/ML IJ SOLN: 5.0000 mg | Freq: Once | INTRAMUSCULAR | Status: DC | PRN

## 2017-08-16 MED ORDER — SCOPOLAMINE 1 MG/3DAYS TD PT72: 1.0000 | MEDICATED_PATCH | Freq: Once | TRANSDERMAL | Status: DC

## 2017-08-16 MED ORDER — DIPHENHYDRAMINE HCL 25 MG PO CAPS: 25.0000 mg | ORAL_CAPSULE | ORAL | Status: DC | PRN

## 2017-08-16 MED ORDER — HYDROMORPHONE HCL 2 MG PO TABS
2.0000 mg | ORAL_TABLET | ORAL | Status: DC | PRN
  Administered 2017-08-16 – 2017-08-17 (×2): 2 mg via ORAL
  Filled 2017-08-16 (×2): qty 1

## 2017-08-16 MED ORDER — SODIUM CHLORIDE 0.9% FLUSH: 3.0000 mL | INTRAVENOUS | Status: DC | PRN

## 2017-08-16 MED ORDER — NALOXONE HCL 2 MG/2ML IJ SOSY
1.0000 ug/kg/h | PREFILLED_SYRINGE | INTRAVENOUS | Status: DC | PRN
  Filled 2017-08-16: qty 2

## 2017-08-16 MED ORDER — NALOXONE HCL 0.4 MG/ML IJ SOLN: 0.4000 mg | INTRAMUSCULAR | Status: DC | PRN

## 2017-08-16 NOTE — Lactation Note (Addendum)
This note was copied from a baby's chart. Lactation Consultation Note Mom's 1st child. Mom has long pendulous breast w/flat nipples. Hand expression w/1 ml colostrum collected. Baby had no interest in latching d/t emesis. Nipple everts slightly w/finger stimulation. Baby spitting up clear fluid. Discussed newborn behavior, feeding habits, STS, I&O, supply and demand.  Mom encouraged to feed baby 8-12 times/24 hours and with feeding cues. Discussed cluster feeding.  Mom shown how to use DEBP & how to disassemble, clean, & reassemble parts. Mom knows to pump q3h for 15-20 min.  Gave mom hand pump as well to pre-pump to evert nipples. Discussed breast massage. Shells given to assist everting nipples. Answered a lot of questions. Discussed football hold, placed cloth under large breast for support. Discussed holding positioning.  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 Discussed what is normal and what isn't.  Encouraged mom to call for assistance.  [facility] brochure given w/resources, support groups and LC services.   Patient Name: Michele Warren ZOXWR'UToday's Date: 08/16/2017 Reason for consult: Initial assessment   Maternal Data Has patient been taught Hand Expression?: Yes Does the patient have breastfeeding experience prior to this delivery?: No  Feeding Length of feed: 0 min  LATCH Score Latch: Too sleepy or reluctant, no latch achieved, no sucking elicited.  Audible Swallowing: None  Type of Nipple: Inverted  Comfort (Breast/Nipple): Soft / non-tender        Interventions Interventions: Breast feeding basics reviewed;Breast compression;Shells;Skin to skin;Adjust position;Comfort gels;Breast massage;Support pillows;Hand pump;Hand express;Position options;DEBP;Pre-pump if needed;Expressed milk  Lactation Tools Discussed/Used Tools: Shells;Pump Shell Type: Inverted Breast pump type: Double-Electric Breast Pump;Manual Pump Review: Setup, frequency, and cleaning;Milk Storage Initiated by:: Peri JeffersonL.  Carri Spillers RN IBCLC Date initiated:: 08/16/17   Consult Status Consult Status: Follow-up Date: 08/16/17(in pm) Follow-up type: In-patient    Charyl DancerCARVER, [name] 08/16/2017, 6:21 AM

## 2017-08-16 NOTE — Lactation Note (Signed)
This note was copied from a baby's chart. Lactation Consultation Note  Patient Name: Michele Warren NUUVO'ZToday's Date: 08/16/2017 Reason for consult: Follow-up assessment  Baby 27 hours old. FOB holding sleeping infant. Mom reports that baby was circumcised to day and she hand expressed and spoon fed baby earlier. Mom states that she is very sleepy right now and needs to rest. Enc mom to call for [facility] when baby cueing to nurse.   Maternal Data    Feeding Feeding Type: Breast Fed Length of feed: 30 min  LATCH Score                   Interventions    Lactation Tools Discussed/Used     Consult Status Consult Status: Follow-up Date: 08/16/17 Follow-up type: In-patient    Sherlyn HayJennifer D Layne Lebon 08/16/2017, 6:15 PM

## 2017-08-16 NOTE — Addendum Note (Signed)
Addendum  created 08/16/17 [id number] by Shanon PayorGregory, Caylea Foronda M, CRNA   Sign clinical note

## 2017-08-16 NOTE — Anesthesia Postprocedure Evaluation (Signed)
Anesthesia Post Note  Patient: Michele BunkerJasmine N Warren  Procedure(s) Performed: CESAREAN SECTION (N/A )     Patient location during evaluation: Mother Baby Anesthesia Type: Epidural Level of consciousness: awake and alert and oriented Pain management: pain level controlled Vital Signs Assessment: post-procedure vital signs reviewed and stable Respiratory status: spontaneous breathing and nonlabored ventilation Cardiovascular status: stable Postop Assessment: no headache, no backache, patient able to bend at knees, no signs of nausea or vomiting and adequate PO intake Anesthetic complications: no    Last Vitals:  Vitals:   08/16/17 0606 08/16/17 0800  BP: (!) 111/59   Pulse: 67   Resp: 18 18  Temp: 36.7 C   SpO2: 100% 95%    Last Pain:  Vitals:   08/16/17 0606  TempSrc: Oral  PainSc: 0-No pain   Pain Goal:                 Michele Warren

## 2017-08-16 NOTE — Progress Notes (Signed)
Patient doing well No complaints. BP (!) 111/59 (BP Location: Left Arm)   Pulse 67   Temp 98 F (36.7 C) (Oral)   Resp 18   Ht 5\' 9"  (1.753 m)   Wt 126.1 kg (278 lb)   LMP 11/03/2016   SpO2 95%   BMI 41.05 kg/m  Results for orders placed or performed during the hospital encounter of 08/15/17 (from the past 24 hour(s))  CBC     Status: Abnormal   Collection Time: 08/16/17  5:18 AM  Result Value Ref Range   WBC 16.9 (H) 4.0 - 10.5 K/uL   RBC 4.92 3.87 - 5.11 MIL/uL   Hemoglobin 11.3 (L) 12.0 - 15.0 g/dL   HCT 69.632.1 (L) 29.536.0 - 28.446.0 %   MCV 65.2 (L) 78.0 - 100.0 fL   MCH 23.0 (L) 26.0 - 34.0 pg   MCHC 35.2 30.0 - 36.0 g/dL   RDW 13.217.9 (H) 44.011.5 - 10.215.5 %   Platelets 222 150 - 400 K/uL   Abdomen is soft and non tender Bandage clean and dry Routine care Circ later today if MD check done

## 2017-08-16 NOTE — Plan of Care (Signed)
Patient is up and ambulating well, pain is low.  Patient is voiding and passing flatus

## 2017-08-17 ENCOUNTER — Encounter (HOSPITAL_COMMUNITY): Payer: Self-pay | Admitting: *Deleted

## 2017-08-17 LAB — BIRTH TISSUE RECOVERY COLLECTION (PLACENTA DONATION)

## 2017-08-17 MED ORDER — HYDROMORPHONE HCL 2 MG PO TABS: 2.0000 mg | ORAL_TABLET | ORAL | 0 refills | Status: DC | PRN

## 2017-08-17 MED ORDER — IBUPROFEN 600 MG PO TABS: 600.0000 mg | ORAL_TABLET | Freq: Four times a day (QID) | ORAL | 1 refills | Status: DC

## 2017-08-17 NOTE — Lactation Note (Signed)
This note was copied from a baby's chart. Lactation Consultation Note Mom tearful stating baby wasn't getting enough and she was doing the best she can. Mom is hand expressing colostrum, giving to baby. Mom hadn't pumped since yesterday evening. Encouraged mom to pump for stimulation. Breast are heavy. Mom has edema as well as breast may be filling some. Hand expression w/colostrum from flat nipples. Mom stating she can only feed well to Lt. Breast. Latched baby to Rt. Breast w/#20 NS. Baby fed for 5 min. Then sound a sleep. Stimulated to cont,. To feed, but in deep sleep. Colostrum in NS. swaddel baby placed in crib. Discussed w/mom feeding breast first, then supplement w/colostrum, then with formula if not enough colostrum. Gave mom formula feeding information sheet.  Mom stated she wanted to go home today. Discussed w/mom LC didn't advise mom to be discharged today d/t new mom, baby has been spitty, hasn't been BF well enough to be discharged. Mom is overwhelmed, LC feels that mom needs to feel more confident w/feedings.  Patient Name: Michele Dreama SaaJasmine Warren ZOXWR'UToday's Date: 08/17/2017 Reason for consult: Follow-up assessment   Maternal Data    Feeding Feeding Type: Breast Fed Nipple Type: Regular Length of feed: 5 min  LATCH Score Latch: Grasps breast easily, tongue down, lips flanged, rhythmical sucking.  Audible Swallowing: Spontaneous and intermittent  Type of Nipple: Flat  Comfort (Breast/Nipple): Soft / non-tender  Hold (Positioning): Assistance needed to correctly position infant at breast and maintain latch.  LATCH Score: 8  Interventions Interventions: Breast feeding basics reviewed;Assisted with latch;Breast compression;Shells;Skin to skin;Adjust position;Breast massage;Support pillows;Hand express;Position options  Lactation Tools Discussed/Used Tools: Shells;Pump;Nipple Shields Nipple shield size: 20 Shell Type: Inverted Breast pump type: Double-Electric Breast  Pump   Consult Status Consult Status: Follow-up Date: 08/17/17(in pm) Follow-up type: In-patient    Charyl DancerCARVER, Odean Mcelwain G 08/17/2017, 7:13 AM

## 2017-08-17 NOTE — Lactation Note (Signed)
This note was copied from a baby's chart. Lactation Consultation Note  Patient Name: Michele Warren [id number] Date: 08/17/2017 Reason for consult: Follow-up assessment;Nipple pain/trauma;1st time breastfeeding;Difficult latch;Infant weight loss  Follow up visit at 43 hours of age. Baby is at 8% weight loss with 2 voids and 4 stools in past 24 hours.  Mom has started some formula supplementing.  Mom reports waiting for discharge now and has follow up with peds in 2 days. [place] encouraged o/p lactation follow up 08/20/17 at 10:00 and will try to confirm appointment with clinic via in basket epic message.  Mom reports limited pumping with some hand expression. Lc discussed the importance of stimulating breasts well at least 8x/day to establish a good milk supply.  Lc discussed further importance while using NS and need for post pumping.  Lc provided mom with additional NS at home as needed.   LC discussed normal newborn feedings and output to monitor at home and gave additional feeding diary.  Mom reports she has formula at home to use as needed and has a personal DEBP unknown brand. Lc discussed use of medela DEBP kit piston pump as needed for home use. Discussed milk transitioning to larger volume, engorgement care discussed.  Encouraged frequent feedings.    Maternal Data Has patient been taught Hand Expression?: Yes Does the patient have breastfeeding experience prior to this delivery?: No  Feeding Feeding Type: Breast Fed Length of feed: 25 min  LATCH Score Latch: Grasps breast easily, tongue down, lips flanged, rhythmical sucking.  Audible Swallowing: A few with stimulation  Type of Nipple: Flat  Comfort (Breast/Nipple): Soft / non-tender  Hold (Positioning): Assistance needed to correctly position infant at breast and maintain latch.  LATCH Score: 7  Interventions Interventions: Coconut oil;Hand express  Lactation Tools Discussed/Used Nipple shield size: 24 Initiated  by:: LC instructed on use of piston pump with DEBP kit for home use as needed   Consult Status Consult Status: Follow-up Date: 08/20/17 Follow-up type: Out-patient    Malena Edman 08/17/2017, 10:34 AM

## 2017-08-17 NOTE — Discharge Summary (Signed)
Obstetric Discharge Summary Reason for Admission: induction of labor Prenatal Procedures: none Intrapartum Procedures: cesarean: low cervical, transverse Postpartum Procedures: none Complications-Operative and Postpartum: none Hemoglobin  Date Value Ref Range Status  08/16/2017 11.3 (L) 12.0 - 15.0 g/dL Final   HCT  Date Value Ref Range Status  08/16/2017 32.1 (L) 36.0 - 46.0 % Final    Physical Exam:  General: alert Lochia: appropriate Uterine Fundus: firm Incision: healing well DVT Evaluation: No evidence of DVT seen on physical exam.  Discharge Diagnoses: Term Pregnancy-delivered, LTCS for fetal intol to labor  Discharge Information: Date: 08/17/2017 Activity: pelvic rest Diet: routine Medications: PNV, Ibuprofen and hydrocodone Condition: stable Instructions: refer to practice specific booklet Discharge to: home Follow-up Information    [place], [facility] Schedule an appointment as soon as possible for a visit in 1 week(s).   Contact information: [address] [place] [id number] [phone]           Newborn Data: Live born female  Birth Weight: 7 lb 11.5 oz (3500 g) APGAR: 9, 9  Newborn Delivery   Birth date/time:  08/15/2017 15:11:00 Delivery type:  C-Section, Low Transverse C-section categorization:  Primary     Home with mother.  Takeshia Wenk M 08/17/2017, 8:24 AM

## 2017-08-28 ENCOUNTER — Telehealth (HOSPITAL_COMMUNITY): Payer: Self-pay | Admitting: Lactation Services

## 2017-08-28 NOTE — Telephone Encounter (Signed)
Baby at 2wk of life. And baby is no longer latching. Mom has a "red, hot, sore" spot on the inside of the left breast. Warm moist compress for 15 minutes before pumping. Leave over toward the ground while pumping. Use the DEBP 8-12x/24hr. She is using a Pure Expressions pump and does not know what size flange she is using, reviewed sizing and hands on pumping. Given [employer] video links. If conditions worsens she will call PCP. If the lump does not improve in 24 hr mom will make an OP apt with lactation.

## 2018-09-30 ENCOUNTER — Inpatient Hospital Stay (HOSPITAL_COMMUNITY)
Admission: AD | Admit: 2018-09-30 | Discharge: 2018-09-30 | Disposition: A | Discharge status: Home / Self Care | Payer: 59 | Source: Ambulatory Visit | Attending: Obstetrics and Gynecology | Admitting: Obstetrics and Gynecology

## 2018-09-30 ENCOUNTER — Encounter (HOSPITAL_COMMUNITY): Payer: Self-pay | Admitting: *Deleted

## 2018-09-30 ENCOUNTER — Other Ambulatory Visit: Payer: Self-pay

## 2018-09-30 DIAGNOSIS — Z3201 Encounter for pregnancy test, result positive: Secondary | ICD-10-CM | POA: Insufficient documentation

## 2018-09-30 DIAGNOSIS — Z3202 Encounter for pregnancy test, result negative: Secondary | ICD-10-CM

## 2018-09-30 DIAGNOSIS — R109 Unspecified abdominal pain: Secondary | ICD-10-CM | POA: Diagnosis present

## 2018-09-30 LAB — HCG, QUANTITATIVE, PREGNANCY: hCG, Beta Chain, Quant, S: 13 m[IU]/mL — ABNORMAL HIGH (ref <5)

## 2018-09-30 LAB — URINALYSIS, ROUTINE W REFLEX MICROSCOPIC
Bilirubin Urine: NEGATIVE
Glucose, UA: NEGATIVE mg/dL
KETONES UR: 5 mg/dL — AB
NITRITE: NEGATIVE
PROTEIN: NEGATIVE mg/dL
Specific Gravity, Urine: 1.032 — ABNORMAL HIGH (ref 1.005–1.030)
pH: 5 (ref 5.0–8.0)

## 2018-09-30 LAB — POCT PREGNANCY, URINE: PREG TEST UR: NEGATIVE

## 2018-09-30 NOTE — MAU Note (Signed)
Went to [facility] prior to coming here. Had period on 12/15, also had a faint +preg test earlier that day.  Period lasted 6 days, a little heavier then usual. Neg test at end of that bleeding.  2 days ago, started bleeding, light spotting.  [facility] there because she couldn't get in to OB. Intermittent cramping.

## 2018-09-30 NOTE — Discharge Instructions (Signed)
Human Chorionic Gonadotropin Test °Why am I having this test? °A human chorionic gonadotropin (hCG) test is done to determine whether you are pregnant. It can also be used: °· To diagnose an abnormal pregnancy. °· To determine whether you have had a failed pregnancy (miscarriage) or are at risk of one. °What is being tested? °This test checks the level of the human chorionic gonadotropin (hCG) hormone in the blood. This hormone is produced during pregnancy by the cells that form the placenta. The placenta is the organ that grows inside your womb (uterus) to nourish a developing baby. When you are pregnant, hCG can be detected in your blood or urine 7 to 8 days before your missed period. It continues to go up for the first 8-10 weeks of pregnancy. °The presence of hCG in your blood can be measured with several different types of tests. You may have: °· A urine test. °? Because this hormone is eliminated from your body by your kidneys, you may have a urine test to find out whether you are pregnant. A home pregnancy test detects whether there is hCG in your urine. °? A urine test only shows whether there is hCG in your urine. It does not measure how much. °· A qualitative blood test. °? You may have this type of blood test to find out if you are pregnant. °? This blood test only shows whether there is hCG in your blood. It does not measure how much. °· A quantitative blood test. °? This type of blood test measures the amount of hCG in your blood. °? You may have this test to: °§ Diagnose an abnormal pregnancy. °§ Check whether you have had a miscarriage. °§ Determine whether you are at risk of a miscarriage. °What kind of sample is taken? ° °  ° °Two kinds of samples may be collected to test for the hCG hormone. °· Blood. It is usually collected by inserting a needle into a blood vessel. °· Urine. It is usually collected by urinating into a germ-free (sterile) specimen cup. It is best to collect the sample the first  time you urinate in the morning. °How do I prepare for this test? °No preparation is needed for a blood test.  °For the urine test: °· Let your health care provider know about: °? All medicines you are taking, including vitamins, herbs, creams, and over-the-counter medicines. °? Any blood in your urine. This may interfere with the result. °· Do not drink too much fluid. Drink as you normally would, or as directed by your health care provider. °How are the results reported? °Depending on the type of test that you have, your test results may be reported as values. Your health care provider will compare your results to normal ranges that were established after testing a large group of people (reference ranges). Reference ranges may vary among labs and hospitals. For this test, common reference ranges that show absence of pregnancy are: °· Quantitative hCG blood levels: less than 5 IU/L. °Other results will be reported as either positive or negative. For this test, normal results (meaning the absence of pregnancy) are: °· Negative for hCG in the urine test. °· Negative for hCG in the qualitative blood test. °What do the results mean? °Urine and qualitative blood test °· A negative result could mean: °? That you are not pregnant. °? That the test was done too early in your pregnancy to detect hCG in your blood or urine. If you still have other signs   of pregnancy, the test will be repeated. °· A positive result means: °? That you are most likely pregnant. Your health care provider may confirm your pregnancy with an imaging study (ultrasound) of your uterus, if needed. °Quantitative blood test °Results of the quantitative hCG blood test will be interpreted as follows: °· Less than 5 IU/L: You are most likely not pregnant. °· Greater than 25 IU/L: You are most likely pregnant. °· hCG levels that are higher than expected: °? You are pregnant with twins. °? You have abnormal growths in the uterus. °· hCG levels that are  rising more slowly than expected: °? You have an ectopic pregnancy (also called a tubal pregnancy). °· hCG levels that are falling: °? You may be having a miscarriage. °Talk with your health care provider about what your results mean. °Questions to ask your health care provider °Ask your health care provider, or the department that is doing the test: °· When will my results be ready? °· How will I get my results? °· What are my treatment options? °· What other tests do I need? °· What are my next steps? °Summary °· A human chorionic gonadotropin test is done to determine whether you are pregnant. °· When you are pregnant, hCG can be detected in your blood or urine 7 to 8 days before your missed period. It continues to go up for the first 8-10 weeks of pregnancy. °· Your hCG level can be measured with different types of tests. You may have a urine test, a qualitative blood test, or a quantitative blood test. °· Talk with your health care provider about what your results mean. °This information is not intended to replace advice given to you by your health care provider. Make sure you discuss any questions you have with your health care provider. °Document Released: 10/27/2004 Document Revised: 08/27/2017 Document Reviewed: 08/27/2017 °Elsevier Interactive Patient Education © 2019 [employer]vier Inc. ° °

## 2018-09-30 NOTE — MAU Provider Note (Signed)
History     CSN: [id number]  Arrival date and time: 09/30/18 1541   First Provider Initiated Contact with Patient 09/30/18 1703      Chief Complaint  Patient presents with  . Abdominal Pain  . Vaginal Bleeding  . Possible Pregnancy   HPI Michele Warren is a 32 y.o. G1P0 who presents to MAU for evaluation of vaginal bleeding and pregnancy. Patient endorses LMP of 09/22/18, with a negative pregnancy test at the end of her period as expected. She then noted new recurrence of bleeding on 09/28/18 and had a positive UPT at urgent care.   OB History    Gravida  1   Para      Term      Preterm      AB      Living        SAB      TAB      Ectopic      Multiple      Live Births              Past Medical History:  Diagnosis Date  . Allergy   . Anemia   . Headache     Past Surgical History:  Procedure Laterality Date  . CESAREAN SECTION N/A 08/15/2017   Procedure: CESAREAN SECTION;  Surgeon: Mitchel HonourMorris, Megan, DO;  Location: [facility];  Service: Obstetrics;  Laterality: N/A;  . CHOLECYSTECTOMY    . DILATION AND CURETTAGE OF UTERUS    . TONSILLECTOMY    . WISDOM TOOTH EXTRACTION      Family History  Problem Relation Age of Onset  . Cancer Maternal Grandmother   . Hypertension Maternal Grandfather   . Prostate cancer Maternal Uncle   . Diabetes Paternal Aunt   . Diabetes Paternal Uncle   . Pancreatic cancer Maternal Uncle     Social History   Tobacco Use  . Smoking status: Never Smoker  . Smokeless tobacco: Never Used  Substance Use Topics  . Alcohol use: No  . Drug use: No    Allergies:  Allergies  Allergen Reactions  . Hydrocodone Anaphylaxis    Throat swells    No medications prior to admission.    Review of Systems  Constitutional: Negative for chills, fatigue and fever.  Gastrointestinal: Negative for abdominal pain.  Genitourinary: Negative for vaginal bleeding, vaginal discharge and vaginal pain.  Musculoskeletal:  Negative for back pain.  Neurological: Negative for dizziness and headaches.  All other systems reviewed and are negative.  Physical Exam   Blood pressure 124/78, pulse 78, temperature 98.1 F (36.7 C), temperature source Oral, resp. rate 18, weight 128 kg, last menstrual period 09/22/2018, SpO2 100 %, unknown if currently breastfeeding.  Physical Exam  Nursing note and vitals reviewed. Constitutional: She is oriented to person, place, and time. She appears well-developed and well-nourished.  Cardiovascular: Normal rate.  Respiratory: Effort normal.  GI: Soft.  Neurological: She is alert and oriented to person, place, and time.  Skin: Skin is warm and dry.  Psychiatric: She has a normal mood and affect. Her behavior is normal. Judgment and thought content normal.    MAU Course/MDM   --Negative pregnancy test in MAU --Quant hCG of 13 --Reviewed with patient  Patient Vitals for the past 24 hrs:  BP Temp Temp src Pulse Resp SpO2 Weight  09/30/18 1558 124/78 98.1 F (36.7 C) Oral 78 18 100 % 128 kg    Results for orders placed or performed during the hospital  encounter of 09/30/18 (from the past 24 hour(s))  Urinalysis, Routine w reflex microscopic     Status: Abnormal   Collection Time: 09/30/18  4:03 PM  Result Value Ref Range   Color, Urine YELLOW YELLOW   APPearance HAZY (A) CLEAR   Specific Gravity, Urine 1.032 (H) 1.005 - 1.030   pH 5.0 5.0 - 8.0   Glucose, UA NEGATIVE NEGATIVE mg/dL   Hgb urine dipstick SMALL (A) NEGATIVE   Bilirubin Urine NEGATIVE NEGATIVE   Ketones, ur 5 (A) NEGATIVE mg/dL   Protein, ur NEGATIVE NEGATIVE mg/dL   Nitrite NEGATIVE NEGATIVE   Leukocytes, UA SMALL (A) NEGATIVE   RBC / HPF 0-5 0 - 5 RBC/hpf   WBC, UA 21-50 0 - 5 WBC/hpf   Bacteria, UA RARE (A) NONE SEEN   Squamous Epithelial / LPF 11-20 0 - 5   Mucus PRESENT   Pregnancy, urine POC     Status: None   Collection Time: 09/30/18  4:06 PM  Result Value Ref Range   Preg Test, Ur  NEGATIVE NEGATIVE  hCG, quantitative, pregnancy     Status: Abnormal   Collection Time: 09/30/18  5:10 PM  Result Value Ref Range   hCG, Beta Chain, Quant, S 13 (H) <5 mIU/mL     Assessment and Plan  --32 y.o. G1P0, quant hCG of 13 --Blood type B POS --Reviewed first trimester, ectoptic and bleeding precautions  --Discharge home in stable condition  F[facility]U: [facility]  Calvert CantorSamantha C Weinhold, CNM 09/30/2018, 6:52 PM

## 2018-10-07 ENCOUNTER — Ambulatory Visit: Payer: 59

## 2018-10-20 ENCOUNTER — Inpatient Hospital Stay (HOSPITAL_COMMUNITY): Payer: 59

## 2018-10-20 ENCOUNTER — Encounter (HOSPITAL_COMMUNITY): Payer: Self-pay | Admitting: *Deleted

## 2018-10-20 ENCOUNTER — Inpatient Hospital Stay (HOSPITAL_COMMUNITY)
Admission: AD | Admit: 2018-10-20 | Discharge: 2018-10-20 | Disposition: A | Discharge status: Home / Self Care | Payer: 59 | Attending: Obstetrics & Gynecology | Admitting: Obstetrics & Gynecology

## 2018-10-20 DIAGNOSIS — O3680X0 Pregnancy with inconclusive fetal viability, not applicable or unspecified: Secondary | ICD-10-CM | POA: Diagnosis not present

## 2018-10-20 DIAGNOSIS — O209 Hemorrhage in early pregnancy, unspecified: Secondary | ICD-10-CM

## 2018-10-20 LAB — URINALYSIS, ROUTINE W REFLEX MICROSCOPIC
Bilirubin Urine: NEGATIVE
Glucose, UA: NEGATIVE mg/dL
KETONES UR: NEGATIVE mg/dL
Nitrite: NEGATIVE
Protein, ur: 30 mg/dL — AB
Specific Gravity, Urine: 1.026 (ref 1.005–1.030)
WBC, UA: >50 WBC/hpf — ABNORMAL HIGH (ref 0–5)
pH: 6 (ref 5.0–8.0)

## 2018-10-20 LAB — WET PREP, GENITAL
Clue Cells Wet Prep HPF POC: NONE SEEN
Sperm: NONE SEEN
Trich, Wet Prep: NONE SEEN
Yeast Wet Prep HPF POC: NONE SEEN

## 2018-10-20 LAB — CBC
HCT: 35.7 % — ABNORMAL LOW (ref 36.0–46.0)
Hemoglobin: 11.8 g/dL — ABNORMAL LOW (ref 12.0–15.0)
MCH: 21.7 pg — ABNORMAL LOW (ref 26.0–34.0)
MCHC: 33.1 g/dL (ref 30.0–36.0)
MCV: 65.5 fL — ABNORMAL LOW (ref 80.0–100.0)
Platelets: 301 10*3/uL (ref 150–400)
RBC: 5.45 MIL/uL — ABNORMAL HIGH (ref 3.87–5.11)
RDW: 16.9 % — AB (ref 11.5–15.5)
WBC: 8.2 10*3/uL (ref 4.0–10.5)
nRBC: 0 % (ref 0.0–0.2)

## 2018-10-20 LAB — HCG, QUANTITATIVE, PREGNANCY: hCG, Beta Chain, Quant, S: 12 m[IU]/mL — ABNORMAL HIGH (ref <5)

## 2018-10-20 NOTE — MAU Provider Note (Signed)
History     CSN: [id number]  Arrival date and time: 10/20/18 [id number]   First Provider Initiated Contact with Patient 10/20/18 1018      Chief Complaint  Patient presents with  . Vaginal Bleeding   HPI Michele Warren 33 y.o. Unknown gestational age Client was seen in MAU on 09-30-18 and had quant of 6313.  She was supposted to come back for blood work but went out of town for Christmas.  Then she went to her GYN annual visit on 10-17-18.  The pregnancy test was positive there.  She had blood work drawn and she was to return on 10-21-18 (tomorrow) for repeat blood work.  She has had on and off spotting which she does not think is related to her having intercourse.  She has pressure in her abdomen but denies pain except for headaches.  Today the spotting is much worse.  It was in the toilet this morning not just when she wiped.  It is brown in color.  She wants to know what is happening.  Client of [facility].  OB History    Gravida  1   Para      Term      Preterm      AB      Living        SAB      TAB      Ectopic      Multiple      Live Births              Past Medical History:  Diagnosis Date  . Allergy   . Anemia   . Headache     Past Surgical History:  Procedure Laterality Date  . CESAREAN SECTION N/A 08/15/2017   Procedure: CESAREAN SECTION;  Surgeon: Mitchel HonourMorris, Megan, DO;  Location: [facility];  Service: Obstetrics;  Laterality: N/A;  . CHOLECYSTECTOMY    . DILATION AND CURETTAGE OF UTERUS    . TONSILLECTOMY    . WISDOM TOOTH EXTRACTION      Family History  Problem Relation Age of Onset  . Cancer Maternal Grandmother   . Hypertension Maternal Grandfather   . Prostate cancer Maternal Uncle   . Diabetes Paternal Aunt   . Diabetes Paternal Uncle   . Pancreatic cancer Maternal Uncle     Social History   Tobacco Use  . Smoking status: Never Smoker  . Smokeless tobacco: Never Used  Substance Use Topics  . Alcohol use:  No  . Drug use: No    Allergies:  Allergies  Allergen Reactions  . Hydrocodone Anaphylaxis    Throat swells    Medications Prior to Admission  Medication Sig Dispense Refill Last Dose  . Prenatal Vit-Fe Fumarate-FA (PRENATAL MULTIVITAMIN) TABS tablet Take 1 tablet daily at 12 noon by mouth.   Unknown at Unknown time    Review of Systems  Constitutional: Negative for fever.  Gastrointestinal: Negative for abdominal pain, nausea and vomiting.  Genitourinary: Positive for vaginal bleeding. Negative for dysuria.   Physical Exam   Blood pressure 131/73, pulse 84, temperature 98.1 F (36.7 C), temperature source Oral, resp. rate 18, weight 128.4 kg, last menstrual period 09/22/2018, SpO2 100 %, unknown if currently breastfeeding.  Physical Exam  Nursing note and vitals reviewed. Constitutional: She is oriented to person, place, and time. She appears well-developed and well-nourished.  HENT:  Head: Normocephalic.  Eyes: EOM are normal.  Neck: Neck supple.  GI: Soft. There is no  abdominal tenderness. There is no rebound and no guarding.  Genitourinary:    Genitourinary Comments: Speculum exam: Vagina - Mod amount of brown discharge and small clots, no odor Cervix - No contact bleeding Bimanual exam: Cervix closed Uterus non tender,unable to size due to habitus, Adnexa non tender, exam limited due to habitus GC/Chlam, wet prep done Chaperone present for exam.    Musculoskeletal: Normal range of motion.  Neurological: She is alert and oriented to person, place, and time.  Skin: Skin is warm and dry.  Psychiatric: She has a normal mood and affect.    MAU Course  Procedures Results for orders placed or performed during the hospital encounter of 10/20/18 (from the past 24 hour(s))  Urinalysis, Routine w reflex microscopic     Status: Abnormal   Collection Time: 10/20/18  9:30 AM  Result Value Ref Range   Color, Urine YELLOW YELLOW   APPearance HAZY (A) CLEAR   Specific  Gravity, Urine 1.026 1.005 - 1.030   pH 6.0 5.0 - 8.0   Glucose, UA NEGATIVE NEGATIVE mg/dL   Hgb urine dipstick MODERATE (A) NEGATIVE   Bilirubin Urine NEGATIVE NEGATIVE   Ketones, ur NEGATIVE NEGATIVE mg/dL   Protein, ur 30 (A) NEGATIVE mg/dL   Nitrite NEGATIVE NEGATIVE   Leukocytes, UA LARGE (A) NEGATIVE   RBC / HPF 0-5 0 - 5 RBC/hpf   WBC, UA >50 (H) 0 - 5 WBC/hpf   Bacteria, UA RARE (A) NONE SEEN   Squamous Epithelial / LPF 0-5 0 - 5   Mucus PRESENT   Wet prep, genital     Status: Abnormal   Collection Time: 10/20/18 10:18 AM  Result Value Ref Range   Yeast Wet Prep HPF POC NONE SEEN NONE SEEN   Trich, Wet Prep NONE SEEN NONE SEEN   Clue Cells Wet Prep HPF POC NONE SEEN NONE SEEN   WBC, Wet Prep HPF POC FEW (A) NONE SEEN   Sperm NONE SEEN   CBC     Status: Abnormal   Collection Time: 10/20/18 10:39 AM  Result Value Ref Range   WBC 8.2 4.0 - 10.5 K/uL   RBC 5.45 (H) 3.87 - 5.11 MIL/uL   Hemoglobin 11.8 (L) 12.0 - 15.0 g/dL   HCT 67.2 (L) 09.4 - 70.9 %   MCV 65.5 (L) 80.0 - 100.0 fL   MCH 21.7 (L) 26.0 - 34.0 pg   MCHC 33.1 30.0 - 36.0 g/dL   RDW 62.8 (H) 36.6 - 29.4 %   Platelets 301 150 - 400 K/uL   nRBC 0.0 0.0 - 0.2 %  hCG, quantitative, pregnancy     Status: Abnormal   Collection Time: 10/20/18 10:39 AM  Result Value Ref Range   hCG, Beta Chain, Quant, S 12 (H) <5 mIU/mL   CLINICAL DATA:  Pregnant patient with abdominal pain and vaginal bleeding.  EXAM: OBSTETRIC <14 WK [place] AND TRANSVAGINAL OB US  TECHNIQUE: Both transabdominal and transvaginal ultrasound examinations were performed for complete evaluation of the gestation as well as the maternal uterus, adnexal regions, and pelvic cul-de-sac. Transvaginal technique was performed to assess early pregnancy.  COMPARISON:  None.  FINDINGS: Intrauterine gestational sac: None  Yolk sac:  Not Visualized.  Embryo:  Not Visualized.  Cardiac Activity: Not Visualized.  Maternal uterus/adnexae:  Normal right and left ovaries. Small amount of free fluid in the pelvis.  IMPRESSION: No intrauterine gestation identified. In the setting of positive pregnancy test and no definite intrauterine pregnancy, this reflects a  pregnancy of unknown location. Differential considerations include early normal IUP, abnormal IUP, or nonvisualized ectopic pregnancy. Differentiation is achieved with serial beta HCG supplemented by repeat sonography as clinically warranted.   MDM Discussed diagnosis of pregnancy of unknown anatomic location with client at length.  Reviewed instructions for ectopic pregnancy: Return to MAU with severe vaginal bleeding or severe abdominal pain.  Patient informed that at this time the pregnancy cannot be confirmed to be in the uterus as no yolk sac is visualized on ultrasound, and that quant level is very low as it was the last time she was in MAU.  Advised to return with worsening symptoms as an ectopic pregnancy cannot be ruled out at this time and this could be a life threatening condition. Pelvic rest - no tampons, no douching, no sex, nothing in the vagina. No heavy lifting or strenuous exercise.  Blood type B positive  Assessment and Plan  Pregnancy of unknown anatomic location Small amount of vaginal bleeding No abdominal pain  Plan Follow up in the office tomorrow as you have been scheduled. Ask the staff to have the MD review the note sent from the visit today. Based on the office lab results and the lab results here from the hospital your doctor will decide on a plan of care. Usually quants are followed until they are zero (below 5).  The hormone levels seem to indicate this is not a normal pregnancy, but for today, you are not in pain and can be followed in the office. See AVS for additional information given to the client.  Bernd Crom L Chestine Belknap 10/20/2018, 10:20 AM

## 2018-10-20 NOTE — Discharge Instructions (Signed)
See the office as planned in the morning.  Advised that your note from today's visit should be at the office.  Have the doctor review your note and determine the plan of care.  Return to MAU with severe vaginal bleeding or severe abdominal pain.  At this time the pregnancy cannot be confirmed to be in the uterus as no yolk sac is visualized on ultrasound.   Return with worsening symptoms as an ectopic pregnancy cannot be ruled out at this time and this could be a life threatening condition. Pelvic rest - no tampons, no douching, no sex, nothing in the vagina. No heavy lifting or strenuous exercise.

## 2018-10-20 NOTE — MAU Note (Signed)
Michele Warren is a 33 y.o. at Unknown here in MAU reporting: that she has had continued vaginal bleeding. Intermittent and spotting. Brown in color; more noticeable when wipes but has donned a panty liner.  LMP: 09/22/18 Onset of complaint: ongoing since 12/19--a few days prior to her mau visit on 12/23. Patient had a negative urine pregnancy test in mau with a positive blood HCG of 13. Patient states she was unable to keep her follow up HCG draw due to being out of town. [facility]  Pain score: denies Vitals:   10/20/18 0937  Pulse: 84  Resp: 18  SpO2: 100%      Lab orders placed from triage: ua

## 2018-10-21 LAB — RPR: RPR Ser Ql: NONREACTIVE

## 2018-10-21 LAB — HIV ANTIBODY (ROUTINE TESTING W REFLEX): HIV Screen 4th Generation wRfx: NONREACTIVE

## 2018-10-22 LAB — GC/CHLAMYDIA PROBE AMP (~~LOC~~) NOT AT ARMC
Chlamydia: NEGATIVE
Neisseria Gonorrhea: NEGATIVE

## 2019-07-16 ENCOUNTER — Telehealth: Payer: Self-pay | Admitting: Lactation Services

## 2019-07-16 NOTE — Telephone Encounter (Signed)
Pt reports she wants to meet with Lactation to be more prepared for breast feeding prior to having infant. Pt is due next month and wants to have a fitting for the pump.   Pt has a prescription for a breast pump. Pt reports she is not sure what pump she is able to get through her insurance.   Discussed with mom that [facility] may be good places to start to see if she can order online.   Pt is planning to call her insurance company to see what is provided through her plan and when she is able to order a pump.   Pt to call back once she is able to get her pump and is able to some in for fitting if she wants to prior to delivery. Pt to ask her OB on Friday to send in a referral to Lactation for appt.   Pt reports she will call back once she gathers more information on pumps available to her.

## 2019-07-16 NOTE — Telephone Encounter (Signed)
This a patient of Dr Gaetano Net. She was told by the [facility] to call this office to make an appointment with lactation. She has not had her baby yet, but wanted to come in to be fitted. The last time she was having issues, so she didn't want to wait to the last minute.

## 2019-07-24 ENCOUNTER — Telehealth (INDEPENDENT_AMBULATORY_CARE_PROVIDER_SITE_OTHER): Payer: 59 | Admitting: Lactation Services

## 2019-07-24 DIAGNOSIS — O9270 Unspecified disorders of lactation: Secondary | ICD-10-CM

## 2019-07-24 DIAGNOSIS — Z3A36 36 weeks gestation of pregnancy: Secondary | ICD-10-CM

## 2019-07-24 NOTE — Telephone Encounter (Signed)
Pt called in and wants to set up appt to be measured for breast pump flanges. Discussed that breast can change between now and delivery and can change again after delivery hence needing to change sizes and it may be in her best interest to wait until she delivers to determine size of flanges. Discussed that the hospital LC's have access to different size flanges.   Mom reports she has flatter nipples. She used the NS with her older son and had difficulty with pain and not being able to keep the NS on. Reviewed that it is not uncommon with flat nipples to have difficulty keeping it in place and may not be needed if breast tissue compressible.   Discussed with mom that breast shells, manual pump and NS may all be helpful depending on how the nipple looks after delivery and how compressible the nipple/areola tissue is.   Discussed breast shells may be helpful for a few days prior to delivery but typically applied post delivery.   Enc mom to look on the [employer] for proper flange fitting. Mom has a Medela pump now. She received # 24 flanges with her pump.   Discussed importance of hand expression and pumping if infant is not able to latch consistently and then setting up an OP appt soon after delivery for follow up. Referred mom to [employer] hand expression video.   Discussed with mom that I would be very glad to meet with her prenatally but feel her time and money may be better spent after infant born. Enc mom to ask for help from nurses and LC's in the hospital as much as she needs it. Mom to call back if she decides to come in prenatally.

## 2019-08-11 LAB — OB RESULTS CONSOLE GBS: GBS: NEGATIVE

## 2019-08-25 ENCOUNTER — Telehealth (HOSPITAL_COMMUNITY): Payer: Self-pay | Admitting: *Deleted

## 2019-08-25 ENCOUNTER — Encounter (HOSPITAL_COMMUNITY): Payer: Self-pay | Admitting: *Deleted

## 2019-08-25 NOTE — Telephone Encounter (Signed)
Preadmission screen  

## 2019-08-28 NOTE — H&P (Signed)
Michele Warren is a 33 y.o. female presenting for IOL. She is S/P C/S for nonreassuring FHT following epidural. She wants TOL. 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 FOB status unknown. U/S 38 3/7 wks>Vtx, EFW 7# 10oz (52%). OB History    Gravida  3   Para  1   Term  1   Preterm      AB  1   Living        SAB  1   TAB      Ectopic      Multiple      Live Births             Past Medical History:  Diagnosis Date  . Allergy   . Anemia   . Headache    Past Surgical History:  Procedure Laterality Date  . CESAREAN SECTION N/A 08/15/2017   Procedure: CESAREAN SECTION;  Surgeon: Linda Hedges, DO;  Location: [place];  Service: Obstetrics;  Laterality: N/A;  . CHOLECYSTECTOMY    . DILATION AND CURETTAGE OF UTERUS    . TONSILLECTOMY    . WISDOM TOOTH EXTRACTION     Family History: family history includes Cancer in her maternal grandmother; Diabetes in her paternal aunt and paternal uncle; Hypertension in her maternal grandfather; Pancreatic cancer in her maternal uncle; Prostate cancer in her maternal uncle. Social History:  reports that she has never smoked. She has never used smokeless tobacco. She reports that she does not drink alcohol or use drugs.     Maternal Diabetes: No Genetic Screening: Normal Maternal Ultrasounds/Referrals: Normal Fetal Ultrasounds or other Referrals:  None Maternal Substance Abuse:  No Significant Maternal Medications:  None Significant Maternal Lab Results:  Group B Strep negative Other Comments:  None  Review of Systems  Eyes: Negative for blurred vision.  Gastrointestinal: Negative for abdominal pain.  Neurological: Negative for tingling and headaches.   Maternal Medical History:  Fetal activity: Perceived fetal activity is normal.        Last menstrual period 09/22/2018, unknown if currently breastfeeding. Maternal Exam:  Abdomen: Fetal presentation:  vertex     Physical Exam  Cardiovascular: Normal rate.  Respiratory: Effort normal.  GI: Soft.    Prenatal labs: ABO, Rh:   Antibody:   Rubella:   RPR: Non Reactive (01/12 1039)  HBsAg:    HIV: Non Reactive (01/12 1039)  GBS:   negative 08/01/19  Assessment/Plan: 33 yo G3P1 at term for TOLAC D/W patient risks including repeat C/S, uterine rupture, hemorrhage requiring transfusion of blood products and hysterectomy with sterilization.    Shon Millet II 08/28/2019, 5:51 PM

## 2019-08-29 ENCOUNTER — Inpatient Hospital Stay (HOSPITAL_COMMUNITY): Payer: No Typology Code available for payment source

## 2019-08-30 ENCOUNTER — Other Ambulatory Visit (HOSPITAL_COMMUNITY)
Admission: RE | Admit: 2019-08-30 | Discharge: 2019-08-30 | Disposition: A | Discharge status: Home / Self Care | Payer: No Typology Code available for payment source | Source: Ambulatory Visit | Attending: Obstetrics and Gynecology | Admitting: Obstetrics and Gynecology

## 2019-08-30 LAB — SARS CORONAVIRUS 2 (TAT 6-24 HRS): SARS Coronavirus 2: NEGATIVE

## 2019-09-01 ENCOUNTER — Encounter (HOSPITAL_COMMUNITY)
Admission: AD | Disposition: A | Discharge status: Home / Self Care | Payer: Self-pay | Source: Home / Self Care | Attending: Obstetrics and Gynecology

## 2019-09-01 ENCOUNTER — Inpatient Hospital Stay (HOSPITAL_COMMUNITY): Payer: No Typology Code available for payment source

## 2019-09-01 ENCOUNTER — Inpatient Hospital Stay (HOSPITAL_COMMUNITY)
Admission: AD | Admit: 2019-09-01 | Discharge: 2019-09-03 | DRG: 788 | Disposition: A | Discharge status: Home / Self Care | Payer: No Typology Code available for payment source | Attending: Obstetrics and Gynecology | Admitting: Obstetrics and Gynecology

## 2019-09-01 ENCOUNTER — Encounter (HOSPITAL_COMMUNITY): Payer: Self-pay | Admitting: *Deleted

## 2019-09-01 ENCOUNTER — Inpatient Hospital Stay (HOSPITAL_COMMUNITY): Payer: No Typology Code available for payment source | Admitting: Anesthesiology

## 2019-09-01 DIAGNOSIS — Z3A40 40 weeks gestation of pregnancy: Secondary | ICD-10-CM | POA: Diagnosis not present

## 2019-09-01 DIAGNOSIS — Z3490 Encounter for supervision of normal pregnancy, unspecified, unspecified trimester: Secondary | ICD-10-CM

## 2019-09-01 DIAGNOSIS — O99214 Obesity complicating childbirth: Secondary | ICD-10-CM | POA: Diagnosis present

## 2019-09-01 DIAGNOSIS — Z20828 Contact with and (suspected) exposure to other viral communicable diseases: Secondary | ICD-10-CM | POA: Diagnosis present

## 2019-09-01 DIAGNOSIS — O26893 Other specified pregnancy related conditions, third trimester: Secondary | ICD-10-CM | POA: Diagnosis present

## 2019-09-01 DIAGNOSIS — O34211 Maternal care for low transverse scar from previous cesarean delivery: Secondary | ICD-10-CM | POA: Diagnosis present

## 2019-09-01 DIAGNOSIS — Z98891 History of uterine scar from previous surgery: Secondary | ICD-10-CM

## 2019-09-01 LAB — CBC
HCT: 38.1 % (ref 36.0–46.0)
Hemoglobin: 12.6 g/dL (ref 12.0–15.0)
MCH: 22.1 pg — ABNORMAL LOW (ref 26.0–34.0)
MCHC: 33.1 g/dL (ref 30.0–36.0)
MCV: 66.8 fL — ABNORMAL LOW (ref 80.0–100.0)
Platelets: 231 10*3/uL (ref 150–400)
RBC: 5.7 MIL/uL — ABNORMAL HIGH (ref 3.87–5.11)
RDW: 17.2 % — ABNORMAL HIGH (ref 11.5–15.5)
WBC: 10.7 10*3/uL — ABNORMAL HIGH (ref 4.0–10.5)
nRBC: 0 % (ref 0.0–0.2)

## 2019-09-01 LAB — ABO/RH: ABO/RH(D): B POS

## 2019-09-01 LAB — TYPE AND SCREEN
ABO/RH(D): B POS
Antibody Screen: NEGATIVE

## 2019-09-01 LAB — RPR: RPR Ser Ql: NONREACTIVE

## 2019-09-01 SURGERY — Surgical Case
Anesthesia: Epidural

## 2019-09-01 MED ORDER — OXYTOCIN 40 UNITS IN NORMAL SALINE INFUSION - SIMPLE MED: 2.5000 [IU]/h | INTRAVENOUS | Status: AC

## 2019-09-01 MED ORDER — SIMETHICONE 80 MG PO CHEW: 80.0000 mg | CHEWABLE_TABLET | ORAL | Status: DC | PRN

## 2019-09-01 MED ORDER — EPHEDRINE 5 MG/ML INJ: 10.0000 mg | INTRAVENOUS | Status: DC | PRN

## 2019-09-01 MED ORDER — MEPERIDINE HCL 25 MG/ML IJ SOLN
INTRAMUSCULAR | Status: DC | PRN
  Administered 2019-09-01: 25 mg via INTRAVENOUS

## 2019-09-01 MED ORDER — LACTATED RINGERS IV SOLN
INTRAVENOUS | Status: DC | PRN
  Administered 2019-09-01: 21:00:00 via INTRAVENOUS

## 2019-09-01 MED ORDER — NALBUPHINE HCL 10 MG/ML IJ SOLN: 5.0000 mg | Freq: Once | INTRAMUSCULAR | Status: DC | PRN

## 2019-09-01 MED ORDER — LIDOCAINE HCL (PF) 1 % IJ SOLN
INTRAMUSCULAR | Status: DC | PRN
  Administered 2019-09-01: 5 mL via EPIDURAL

## 2019-09-01 MED ORDER — COCONUT OIL OIL: 1.0000 "application " | TOPICAL_OIL | Status: DC | PRN

## 2019-09-01 MED ORDER — SCOPOLAMINE 1 MG/3DAYS TD PT72
MEDICATED_PATCH | TRANSDERMAL | Status: AC
  Filled 2019-09-01: qty 1

## 2019-09-01 MED ORDER — SENNOSIDES-DOCUSATE SODIUM 8.6-50 MG PO TABS
2.0000 | ORAL_TABLET | ORAL | Status: DC
  Administered 2019-09-02 (×2): 2 via ORAL
  Filled 2019-09-01 (×2): qty 2

## 2019-09-01 MED ORDER — KETOROLAC TROMETHAMINE 30 MG/ML IJ SOLN
30.0000 mg | Freq: Once | INTRAMUSCULAR | Status: AC | PRN
  Administered 2019-09-01: 30 mg via INTRAVENOUS

## 2019-09-01 MED ORDER — DEXTROSE 5 % IV SOLN
INTRAVENOUS | Status: DC | PRN
  Administered 2019-09-01: 3 g via INTRAVENOUS

## 2019-09-01 MED ORDER — NALBUPHINE HCL 10 MG/ML IJ SOLN
5.0000 mg | INTRAMUSCULAR | Status: DC | PRN
  Filled 2019-09-01: qty 0.5

## 2019-09-01 MED ORDER — SOD CITRATE-CITRIC ACID 500-334 MG/5ML PO SOLN
30.0000 mL | ORAL | Status: DC | PRN
  Administered 2019-09-01: 30 mL via ORAL

## 2019-09-01 MED ORDER — ACETAMINOPHEN 325 MG PO TABS: 650.0000 mg | ORAL_TABLET | ORAL | Status: DC | PRN

## 2019-09-01 MED ORDER — MORPHINE SULFATE (PF) 0.5 MG/ML IJ SOLN
INTRAMUSCULAR | Status: AC
  Filled 2019-09-01: qty 10

## 2019-09-01 MED ORDER — LACTATED RINGERS IV SOLN: 500.0000 mL | Freq: Once | INTRAVENOUS | Status: DC

## 2019-09-01 MED ORDER — KETOROLAC TROMETHAMINE 30 MG/ML IJ SOLN
INTRAMUSCULAR | Status: AC
  Filled 2019-09-01: qty 1

## 2019-09-01 MED ORDER — FENTANYL CITRATE (PF) 100 MCG/2ML IJ SOLN
INTRAMUSCULAR | Status: DC | PRN
  Administered 2019-09-01: 100 ug via EPIDURAL

## 2019-09-01 MED ORDER — MENTHOL 3 MG MT LOZG: 1.0000 | LOZENGE | OROMUCOSAL | Status: DC | PRN

## 2019-09-01 MED ORDER — FLEET ENEMA 7-19 GM/118ML RE ENEM: 1.0000 | ENEMA | RECTAL | Status: DC | PRN

## 2019-09-01 MED ORDER — MEPERIDINE HCL 25 MG/ML IJ SOLN
INTRAMUSCULAR | Status: AC
  Filled 2019-09-01: qty 1

## 2019-09-01 MED ORDER — OXYTOCIN 40 UNITS IN NORMAL SALINE INFUSION - SIMPLE MED
INTRAVENOUS | Status: AC
  Filled 2019-09-01: qty 1000

## 2019-09-01 MED ORDER — OXYTOCIN 40 UNITS IN NORMAL SALINE INFUSION - SIMPLE MED
2.5000 [IU]/h | INTRAVENOUS | Status: DC
  Filled 2019-09-01: qty 1000

## 2019-09-01 MED ORDER — SODIUM CHLORIDE 0.9% FLUSH: 3.0000 mL | INTRAVENOUS | Status: DC | PRN

## 2019-09-01 MED ORDER — SODIUM CHLORIDE 0.9 % IR SOLN
Status: DC | PRN
  Administered 2019-09-01 (×2): 1

## 2019-09-01 MED ORDER — OXYTOCIN 40 UNITS IN NORMAL SALINE INFUSION - SIMPLE MED
1.0000 m[IU]/min | INTRAVENOUS | Status: DC
  Administered 2019-09-01: 1 m[IU]/min via INTRAVENOUS

## 2019-09-01 MED ORDER — LACTATED RINGERS IV SOLN
INTRAVENOUS | Status: DC
  Administered 2019-09-02 (×2): via INTRAVENOUS

## 2019-09-01 MED ORDER — NALOXONE HCL 4 MG/10ML IJ SOLN
1.0000 ug/kg/h | INTRAVENOUS | Status: DC | PRN
  Filled 2019-09-01: qty 5

## 2019-09-01 MED ORDER — DIPHENHYDRAMINE HCL 50 MG/ML IJ SOLN: 12.5000 mg | INTRAMUSCULAR | Status: DC | PRN

## 2019-09-01 MED ORDER — FENTANYL CITRATE (PF) 100 MCG/2ML IJ SOLN: 50.0000 ug | INTRAMUSCULAR | Status: DC | PRN

## 2019-09-01 MED ORDER — DIBUCAINE (PERIANAL) 1 % EX OINT: 1.0000 "application " | TOPICAL_OINTMENT | CUTANEOUS | Status: DC | PRN

## 2019-09-01 MED ORDER — ONDANSETRON HCL 4 MG/2ML IJ SOLN
4.0000 mg | Freq: Four times a day (QID) | INTRAMUSCULAR | Status: DC | PRN
  Administered 2019-09-01: 4 mg via INTRAVENOUS
  Filled 2019-09-01: qty 2

## 2019-09-01 MED ORDER — WITCH HAZEL-GLYCERIN EX PADS: 1.0000 "application " | MEDICATED_PAD | CUTANEOUS | Status: DC | PRN

## 2019-09-01 MED ORDER — TETANUS-DIPHTH-ACELL PERTUSSIS 5-2.5-18.5 LF-MCG/0.5 IM SUSP: 0.5000 mL | Freq: Once | INTRAMUSCULAR | Status: DC

## 2019-09-01 MED ORDER — ACETAMINOPHEN 500 MG PO TABS
1000.0000 mg | ORAL_TABLET | Freq: Four times a day (QID) | ORAL | Status: DC
  Administered 2019-09-02 – 2019-09-03 (×6): 1000 mg via ORAL
  Filled 2019-09-01 (×7): qty 2

## 2019-09-01 MED ORDER — DIPHENHYDRAMINE HCL 25 MG PO CAPS: 25.0000 mg | ORAL_CAPSULE | ORAL | Status: DC | PRN

## 2019-09-01 MED ORDER — PHENYLEPHRINE 40 MCG/ML (10ML) SYRINGE FOR IV PUSH (FOR BLOOD PRESSURE SUPPORT): 80.0000 ug | PREFILLED_SYRINGE | INTRAVENOUS | Status: DC | PRN

## 2019-09-01 MED ORDER — MORPHINE SULFATE (PF) 0.5 MG/ML IJ SOLN
INTRAMUSCULAR | Status: DC | PRN
  Administered 2019-09-01: 3 mg via EPIDURAL

## 2019-09-01 MED ORDER — PRENATAL MULTIVITAMIN CH
1.0000 | ORAL_TABLET | Freq: Every day | ORAL | Status: DC
  Administered 2019-09-02 – 2019-09-03 (×2): 1 via ORAL
  Filled 2019-09-01 (×2): qty 1

## 2019-09-01 MED ORDER — ZOLPIDEM TARTRATE 5 MG PO TABS: 5.0000 mg | ORAL_TABLET | Freq: Every evening | ORAL | Status: DC | PRN

## 2019-09-01 MED ORDER — HYDROMORPHONE HCL 1 MG/ML IJ SOLN
INTRAMUSCULAR | Status: AC
  Filled 2019-09-01: qty 0.5

## 2019-09-01 MED ORDER — LIDOCAINE-EPINEPHRINE (PF) 2 %-1:200000 IJ SOLN
INTRAMUSCULAR | Status: DC | PRN
  Administered 2019-09-01: 2 mL via EPIDURAL
  Administered 2019-09-01 (×2): 5 mL via EPIDURAL

## 2019-09-01 MED ORDER — DIPHENHYDRAMINE HCL 25 MG PO CAPS
25.0000 mg | ORAL_CAPSULE | Freq: Four times a day (QID) | ORAL | Status: DC | PRN
  Administered 2019-09-02: 25 mg via ORAL
  Filled 2019-09-01: qty 1

## 2019-09-01 MED ORDER — OXYTOCIN BOLUS FROM INFUSION: 500.0000 mL | Freq: Once | INTRAVENOUS | Status: DC

## 2019-09-01 MED ORDER — OXYCODONE HCL 5 MG PO TABS: 5.0000 mg | ORAL_TABLET | Freq: Once | ORAL | Status: DC | PRN

## 2019-09-01 MED ORDER — TERBUTALINE SULFATE 1 MG/ML IJ SOLN: 0.2500 mg | Freq: Once | INTRAMUSCULAR | Status: DC | PRN

## 2019-09-01 MED ORDER — SCOPOLAMINE 1 MG/3DAYS TD PT72
1.0000 | MEDICATED_PATCH | Freq: Once | TRANSDERMAL | Status: DC
  Administered 2019-09-01: 1.5 mg via TRANSDERMAL

## 2019-09-01 MED ORDER — IBUPROFEN 600 MG PO TABS
600.0000 mg | ORAL_TABLET | Freq: Four times a day (QID) | ORAL | Status: DC | PRN
  Administered 2019-09-02 – 2019-09-03 (×4): 600 mg via ORAL
  Filled 2019-09-01 (×3): qty 1

## 2019-09-01 MED ORDER — ONDANSETRON HCL 4 MG/2ML IJ SOLN: 4.0000 mg | Freq: Once | INTRAMUSCULAR | Status: DC | PRN

## 2019-09-01 MED ORDER — HYDROMORPHONE HCL 1 MG/ML IJ SOLN: 0.2000 mg | INTRAMUSCULAR | Status: DC | PRN

## 2019-09-01 MED ORDER — SODIUM CHLORIDE 0.9 % IV SOLN
INTRAVENOUS | Status: DC | PRN
  Administered 2019-09-01: 21:00:00 via INTRAVENOUS

## 2019-09-01 MED ORDER — SOD CITRATE-CITRIC ACID 500-334 MG/5ML PO SOLN
ORAL | Status: AC
  Filled 2019-09-01: qty 30

## 2019-09-01 MED ORDER — PHENYLEPHRINE 40 MCG/ML (10ML) SYRINGE FOR IV PUSH (FOR BLOOD PRESSURE SUPPORT)
80.0000 ug | PREFILLED_SYRINGE | INTRAVENOUS | Status: DC | PRN
  Filled 2019-09-01 (×2): qty 10

## 2019-09-01 MED ORDER — ONDANSETRON HCL 4 MG/2ML IJ SOLN
INTRAMUSCULAR | Status: DC | PRN
  Administered 2019-09-01: 4 mg via INTRAVENOUS

## 2019-09-01 MED ORDER — SIMETHICONE 80 MG PO CHEW
80.0000 mg | CHEWABLE_TABLET | Freq: Three times a day (TID) | ORAL | Status: DC
  Administered 2019-09-02 – 2019-09-03 (×4): 80 mg via ORAL
  Filled 2019-09-01 (×4): qty 1

## 2019-09-01 MED ORDER — HYDROMORPHONE HCL 1 MG/ML IJ SOLN
0.2500 mg | INTRAMUSCULAR | Status: DC | PRN
  Administered 2019-09-01: 0.5 mg via INTRAVENOUS

## 2019-09-01 MED ORDER — LIDOCAINE HCL (PF) 1 % IJ SOLN: 30.0000 mL | INTRAMUSCULAR | Status: DC | PRN

## 2019-09-01 MED ORDER — NALBUPHINE HCL 10 MG/ML IJ SOLN
5.0000 mg | INTRAMUSCULAR | Status: DC | PRN
  Filled 2019-09-01 (×2): qty 0.5

## 2019-09-01 MED ORDER — LACTATED RINGERS IV SOLN: 500.0000 mL | INTRAVENOUS | Status: DC | PRN

## 2019-09-01 MED ORDER — OXYTOCIN 40 UNITS IN NORMAL SALINE INFUSION - SIMPLE MED
INTRAVENOUS | Status: DC | PRN
  Administered 2019-09-01: 40 [IU] via INTRAVENOUS

## 2019-09-01 MED ORDER — LACTATED RINGERS IV SOLN: INTRAVENOUS | Status: DC

## 2019-09-01 MED ORDER — FENTANYL-BUPIVACAINE-NACL 0.5-0.125-0.9 MG/250ML-% EP SOLN
12.0000 mL/h | EPIDURAL | Status: DC | PRN
  Filled 2019-09-01: qty 250

## 2019-09-01 MED ORDER — SODIUM CHLORIDE (PF) 0.9 % IJ SOLN
INTRAMUSCULAR | Status: DC | PRN
  Administered 2019-09-01: 12 mL/h via EPIDURAL

## 2019-09-01 MED ORDER — OXYCODONE HCL 5 MG/5ML PO SOLN: 5.0000 mg | Freq: Once | ORAL | Status: DC | PRN

## 2019-09-01 MED ORDER — SIMETHICONE 80 MG PO CHEW
80.0000 mg | CHEWABLE_TABLET | ORAL | Status: DC
  Administered 2019-09-02 (×2): 80 mg via ORAL
  Filled 2019-09-01 (×2): qty 1

## 2019-09-01 MED ORDER — FENTANYL CITRATE (PF) 100 MCG/2ML IJ SOLN
INTRAMUSCULAR | Status: AC
  Filled 2019-09-01: qty 2

## 2019-09-01 MED ORDER — NALOXONE HCL 0.4 MG/ML IJ SOLN: 0.4000 mg | INTRAMUSCULAR | Status: DC | PRN

## 2019-09-01 MED ORDER — ONDANSETRON HCL 4 MG/2ML IJ SOLN: 4.0000 mg | Freq: Three times a day (TID) | INTRAMUSCULAR | Status: DC | PRN

## 2019-09-01 SURGICAL SUPPLY — 41 items
ADH SKN CLS APL DERMABOND .7 (GAUZE/BANDAGES/DRESSINGS)
APL SKNCLS STERI-STRIP NONHPOA (GAUZE/BANDAGES/DRESSINGS) ×1
BENZOIN TINCTURE PRP APPL 2/3 (GAUZE/BANDAGES/DRESSINGS) ×2 IMPLANT
CHLORAPREP W/TINT 26ML (MISCELLANEOUS) ×3 IMPLANT
CLAMP CORD UMBIL (MISCELLANEOUS) IMPLANT
CLOSURE WOUND 1/2 X4 (GAUZE/BANDAGES/DRESSINGS) ×1
CLOTH BEACON ORANGE TIMEOUT ST (SAFETY) ×3 IMPLANT
DERMABOND ADVANCED (GAUZE/BANDAGES/DRESSINGS)
DERMABOND ADVANCED .7 DNX12 (GAUZE/BANDAGES/DRESSINGS) IMPLANT
DRSG OPSITE POSTOP 4X10 (GAUZE/BANDAGES/DRESSINGS) ×3 IMPLANT
ELECT REM PT RETURN 9FT ADLT (ELECTROSURGICAL) ×3
ELECTRODE REM PT RTRN 9FT ADLT (ELECTROSURGICAL) ×1 IMPLANT
EXTRACTOR VACUUM M CUP 4 TUBE (SUCTIONS) IMPLANT
EXTRACTOR VACUUM M CUP 4' TUBE (SUCTIONS)
GAUZE SPONGE 4X4 12PLY STRL LF (GAUZE/BANDAGES/DRESSINGS) ×4 IMPLANT
GLOVE BIO SURGEON STRL SZ7.5 (GLOVE) ×3 IMPLANT
GLOVE BIOGEL PI IND STRL 7.0 (GLOVE) ×1 IMPLANT
GLOVE BIOGEL PI INDICATOR 7.0 (GLOVE) ×2
GOWN STRL REUS W/TWL LRG LVL3 (GOWN DISPOSABLE) ×6 IMPLANT
KIT ABG SYR 3ML LUER SLIP (SYRINGE) ×3 IMPLANT
NDL HYPO 25X5/8 SAFETYGLIDE (NEEDLE) ×1 IMPLANT
NEEDLE HYPO 25X5/8 SAFETYGLIDE (NEEDLE) ×3 IMPLANT
NS IRRIG 1000ML POUR BTL (IV SOLUTION) ×3 IMPLANT
PACK C SECTION WH (CUSTOM PROCEDURE TRAY) ×3 IMPLANT
PAD ABD 7.5X8 STRL (GAUZE/BANDAGES/DRESSINGS) ×4 IMPLANT
PAD OB MATERNITY 4.3X12.25 (PERSONAL CARE ITEMS) ×3 IMPLANT
PENCIL SMOKE EVAC W/HOLSTER (ELECTROSURGICAL) ×3 IMPLANT
SPONGE GAUZE 4X4 12PLY STER LF (GAUZE/BANDAGES/DRESSINGS) ×4 IMPLANT
STRIP CLOSURE SKIN 1/2X4 (GAUZE/BANDAGES/DRESSINGS) ×1 IMPLANT
SUT MNCRL 0 VIOLET CTX 36 (SUTURE) ×4 IMPLANT
SUT MONOCRYL 0 CTX 36 (SUTURE) ×8
SUT PDS AB 0 CTX 60 (SUTURE) ×5 IMPLANT
SUT PLAIN 0 NONE (SUTURE) IMPLANT
SUT PLAIN 2 0 (SUTURE)
SUT PLAIN 2 0 XLH (SUTURE) ×2 IMPLANT
SUT PLAIN ABS 2-0 CT1 27XMFL (SUTURE) IMPLANT
SUT VIC AB 4-0 KS 27 (SUTURE) ×3 IMPLANT
TOWEL OR 17X24 6PK STRL BLUE (TOWEL DISPOSABLE) ×3 IMPLANT
TRAY FOLEY W/BAG SLVR 14FR LF (SET/KITS/TRAYS/PACK) ×3 IMPLANT
VACUUM CUP M-STYLE MYSTIC II (SUCTIONS) ×2 IMPLANT
WATER STERILE IRR 1000ML POUR (IV SOLUTION) ×3 IMPLANT

## 2019-09-01 NOTE — Brief Op Note (Signed)
09/01/2019  9:49 PM  PATIENT:  Michele Warren  33 y.o. female  PRE-OPERATIVE DIAGNOSIS:  Repeat cesarean section; arrest of dilation  POST-OPERATIVE DIAGNOSIS:  Repeat cesarean section; arrest of dilation  PROCEDURE:  Procedure(s): CESAREAN SECTION (N/A)  SURGEON:  Surgeon(s) and Role:    * Everlene Farrier, MD - Primary  PHYSICIAN ASSISTANT:   ASSISTANTS: none   ANESTHESIA:   epidural  EBL:  Per anesthesia record   BLOOD ADMINISTERED:none  DRAINS: Urinary Catheter (Foley)   LOCAL MEDICATIONS USED:  NONE  SPECIMEN:  Source of Specimen:  placenta  DISPOSITION OF SPECIMEN:  PATHOLOGY  COUNTS:  YES  TOURNIQUET:  * No tourniquets in log *  DICTATION: .Other Dictation: Dictation Number [id number]  PLAN OF CARE: Admit to inpatient   PATIENT DISPOSITION:  PACU - hemodynamically stable.   Delay start of Pharmacological VTE agent (>24hrs) due to surgical blood loss or risk of bleeding: not applicable

## 2019-09-01 NOTE — Anesthesia Postprocedure Evaluation (Signed)
Anesthesia Post Note  Patient: Michele Warren  Procedure(s) Performed: CESAREAN SECTION (N/A )     Patient location during evaluation: Mother Baby Anesthesia Type: Epidural Level of consciousness: oriented and awake and alert Pain management: pain level controlled Vital Signs Assessment: post-procedure vital signs reviewed and stable Respiratory status: spontaneous breathing and respiratory function stable Cardiovascular status: blood pressure returned to baseline and stable Postop Assessment: no headache, no backache, no apparent nausea or vomiting and able to ambulate Anesthetic complications: no    Last Vitals:  Vitals:   09/01/19 2001 09/01/19 2208  BP: (!) 106/56 108/67  Pulse: 81 87  Resp: 19 15  Temp:  36.5 C  SpO2:  100%    Last Pain:  Vitals:   09/01/19 2208  TempSrc: Oral  PainSc: (P) 0-No pain   Pain Goal:                Epidural/Spinal Function Cutaneous sensation: Able to Wiggle Toes (09/01/19 2208), Patient able to flex knees: No (09/01/19 2208), Patient able to lift hips off bed: No (09/01/19 2208), Back pain beyond tenderness at insertion site: No (09/01/19 2208), Progressively worsening motor and/or sensory loss: No (09/01/19 2208), Bowel and/or bladder incontinence post epidural: No (09/01/19 2208)  Barnet Glasgow

## 2019-09-01 NOTE — Progress Notes (Signed)
Cx 4 (at top of cx)/80/-3 FHT there were some repetitive late decels that have responded so far to position change and IV fluids, now Cat one A/P: arrest of dilation In view of little cervical change in over 8 hours and recent run of late decelerations, I recommend repeat cesarean section. D/W patient above. D/W risks including infection, organ damage, bleeding/transfusion-HIV/Hep, DVT/PE, pneumonia. She states she understands and agrees. D/C pitocin and proceed to cesarean section

## 2019-09-01 NOTE — Anesthesia Procedure Notes (Signed)
Epidural Patient location during procedure: OB  Staffing Anesthesiologist: Suzette Battiest, MD Performed: anesthesiologist   Preanesthetic Checklist Completed: patient identified, site marked, surgical consent, pre-op evaluation, timeout performed, IV checked, risks and benefits discussed and monitors and equipment checked  Epidural Patient position: sitting Prep: site prepped and draped and DuraPrep Patient monitoring: continuous pulse ox and blood pressure Approach: midline Location: L3-L4 Injection technique: LOR air  Needle:  Needle type: Tuohy  Needle gauge: 17 G Needle length: 9 cm and 9 Needle insertion depth: 8 cm Catheter type: closed end flexible Catheter size: 19 Gauge Catheter at skin depth: 17 cm Test dose: negative  Assessment Events: blood not aspirated, injection not painful, no injection resistance, negative IV test and no paresthesia

## 2019-09-01 NOTE — Transfer of Care (Signed)
Immediate Anesthesia Transfer of Care Note  Patient: Michele Warren  Procedure(s) Performed: CESAREAN SECTION (N/A )  Patient Location: PACU  Anesthesia Type:Epidural  Level of Consciousness: awake, alert  and oriented  Airway & Oxygen Therapy: Patient Spontanous Breathing  Post-op Assessment: Report given to RN and Post -op Vital signs reviewed and stable  Post vital signs: Reviewed and stable  Last Vitals:  Vitals Value Taken Time  BP    Temp    Pulse 93 09/01/19 2207  Resp 16 09/01/19 2207  SpO2 100 % 09/01/19 2207  Vitals shown include unvalidated device data.  Last Pain:  Vitals:   09/01/19 1919  TempSrc: Oral         Complications: No apparent anesthesia complications

## 2019-09-01 NOTE — Progress Notes (Signed)
No change to H&P per patient history FHT cat one UC irregular Cx 3-4/50/-3/posterior/vtx AROM clear  D/W patient above

## 2019-09-01 NOTE — Progress Notes (Signed)
Pitocin infusing Cx 4/50/-3/not as posterior UCs about q 3 min FHTSome late decels noted earlier when patient sitting    Repositioned and they resolved IUPC placed

## 2019-09-01 NOTE — Anesthesia Preprocedure Evaluation (Signed)
Anesthesia Evaluation  Patient identified by MRN, date of birth, ID band Patient awake    Airway Mallampati: II  TM Distance: >3 FB     Dental   Pulmonary neg pulmonary ROS,    breath sounds clear to auscultation       Cardiovascular negative cardio ROS   Rhythm:Regular Rate:Normal     Neuro/Psych  Headaches,    GI/Hepatic negative GI ROS, Neg liver ROS,   Endo/Other  Morbid obesity  Renal/GU negative Renal ROS     Musculoskeletal   Abdominal   Peds  Hematology negative hematology ROS (+)   Anesthesia Other Findings   Reproductive/Obstetrics (+) Pregnancy (TOLAC. first csec 2/2 fetal distress)                             Anesthesia Physical Anesthesia Plan  ASA: III  Anesthesia Plan: Epidural   Post-op Pain Management:    Induction:   PONV Risk Score and Plan: Treatment may vary due to age or medical condition  Airway Management Planned: Natural Airway  Additional Equipment:   Intra-op Plan:   Post-operative Plan:   Informed Consent: I have reviewed the patients History and Physical, chart, labs and discussed the procedure including the risks, benefits and alternatives for the proposed anesthesia with the patient or authorized representative who has indicated his/her understanding and acceptance.       Plan Discussed with:   Anesthesia Plan Comments:         Anesthesia Quick Evaluation

## 2019-09-02 ENCOUNTER — Encounter (HOSPITAL_COMMUNITY): Payer: Self-pay

## 2019-09-02 LAB — CBC
HCT: 33.8 % — ABNORMAL LOW (ref 36.0–46.0)
Hemoglobin: 11.1 g/dL — ABNORMAL LOW (ref 12.0–15.0)
MCH: 22.2 pg — ABNORMAL LOW (ref 26.0–34.0)
MCHC: 32.8 g/dL (ref 30.0–36.0)
MCV: 67.6 fL — ABNORMAL LOW (ref 80.0–100.0)
Platelets: 188 10*3/uL (ref 150–400)
RBC: 5 MIL/uL (ref 3.87–5.11)
RDW: 17.2 % — ABNORMAL HIGH (ref 11.5–15.5)
WBC: 12.9 10*3/uL — ABNORMAL HIGH (ref 4.0–10.5)
nRBC: 0 % (ref 0.0–0.2)

## 2019-09-02 MED ORDER — TRAMADOL HCL 50 MG PO TABS: 50.0000 mg | ORAL_TABLET | Freq: Four times a day (QID) | ORAL | Status: DC | PRN

## 2019-09-02 NOTE — Op Note (Signed)
NAME: Michele Warren, Michele Warren MEDICAL RECORD [id number] ACCOUNT [id number] DATE OF BIRTH:[birth year] FACILITY: [facility] LOCATION: [facility] PHYSICIAN:Ailyne Pawley E. Jayra Choyce II, MD  OPERATIVE REPORT  DATE OF PROCEDURE:  09/01/2019  PREOPERATIVE DIAGNOSIS:  Arrest of dilation.  POSTOPERATIVE DIAGNOSIS:  Arrest of dilation.  PROCEDURE:  Repeat low transverse cesarean section.  SURGEON:  Harold Hedge, MD   ANESTHESIA:  Epidural.  ESTIMATED BLOOD LOSS:  Per anesthesia record.  SPECIMENS:  Placenta to pathology.  INDICATIONS AND CONSENT:  This patient is a 33 year old G3 P1 with a previous cesarean section.  She wants a trial of labor.  She was admitted this morning at 40-4/7 weeks' estimated gestational age at 3-4 cm dilation, 50% effacement.  Artificial rupture  of membranes for clear fluid was carried out.  After observing for about 4 hours or so she was started on low dose Pitocin.  She received an epidural for labor pain.  Over a period of at least 5 hours or more, she remains basically unchanged at 4 cm  dilation about 80% effacement and -3 station.  IUPC has documented good labor for several hours.  The baby then began having intermittent late decelerations.  Arrest of dilation is discussed with the patient as well as the fetal heart tracing and  recommendation for repeat cesarean section was made.  Potential risks were discussed including but not limited to infection, organ damage, bleeding requiring transfusion of blood products with HIV and hepatitis acquisition, DVT, PE, pneumonia.  The  patient states she understands and agrees and consent was signed on the chart.  DESCRIPTION OF PROCEDURE:  The patient was taken to the operating room where she was identified.  Epidural anesthetic is augmented to a surgical level.  She was placed in the dorsal supine position with a 15-degree left lateral wedge.  After noting an  adequate level of anesthesia, she was then prepped.  Bladder was prepped with  ChloraPrep and a timeout was undertaken.  After 3 minute drying time, she was draped in a sterile fashion.  After testing for adequate epidural anesthesia, skin was entered  through the previous Pfannenstiel scar and dissection was carried out in layers to the peritoneum.  Peritoneum was taken down superiorly and inferiorly.  Vesicouterine peritoneum is somewhat adherent, but was taken down cephalad laterally.  Uterus was  incised in a low transverse manner well above the bladder.  Uterus was entered bluntly with a hemostat.  Uterine incision was then extended bilaterally with fingers.  Baby was then delivered from the vertex position.  A mushroom vacuum extractor was used  to easily elevate the vertex through the incision and the remainder of the baby was delivered without difficulty as well.  Good cry and tone was noted.  After 1 minute observation time the cord was clamped and cut and the baby was handed to waiting  pediatrics team.  Placenta was then delivered manually.  Uterus is clean and involutes well.  Uterine incision was then closed with 2 running imbricating layers of 0 Monocryl suture.  Lavage was carried out.  Good hemostasis was noted.  Tubes and ovaries  are normal bilaterally.  Anterior peritoneum was closed in a running fashion with 0 Monocryl in a running fashion, which was also used to reapproximate the pyramidalis muscle in the midline.  Anterior rectus fascia was closed with a running 0 looped PDS  suture.  Skin was closed in a subcuticular manner with interrupted plain and the skin itself was closed in a subcuticular fashion with a  4-0 Vicryl on a Keith needle.  Benzoin, Steri-Strips, honeycomb and pressure dressings were applied.  All counts  were correct and the patient was taken to recovery room in stable condition.  CN/NUANCE  D:09/01/2019 T:09/02/2019 JOB:[id number]

## 2019-09-02 NOTE — Lactation Note (Signed)
This note was copied from a baby's chart. Lactation Consultation Note Baby 3 hrs old at time of consult. Mom stated baby BF well after delivery. Baby will not latch at this time d/t to sleepy. Baby has no interest in feeding at this time. Mom has large tubular breast. Nipples flat, very compressible.  Hand expressed 8 ml colostrum. Praised mom.  Discussed spoon feeding. Attempted to stimulate baby w/spoon feeding. W/gloved finger dipped in colostrum to stimulate suck. Baby wouldn't suckle on finger. Newborn behavior, STS, I&O, milk storage, breast massage, supply and demand discussed. A lot of teaching done as well as answering questions. Encouraged mom to call for questions or assistance. Lactation brochure given.  Patient Name: Michele Warren MCNOB'S Date: 09/02/2019     Maternal Data    Feeding Feeding Type: Breast Fed  LATCH Score Latch: Repeated attempts needed to sustain latch, nipple held in mouth throughout feeding, stimulation needed to elicit sucking reflex.  Audible Swallowing: A few with stimulation  Type of Nipple: Flat  Comfort (Breast/Nipple): Soft / non-tender  Hold (Positioning): Assistance needed to correctly position infant at breast and maintain latch.  LATCH Score: 6  Interventions Interventions: Hand express;Skin to skin;Assisted with latch;Adjust position  Lactation Tools Discussed/Used     Consult Status      Michele Warren G 09/02/2019, 1:29 AM

## 2019-09-02 NOTE — Plan of Care (Signed)

## 2019-09-02 NOTE — Anesthesia Postprocedure Evaluation (Signed)
Anesthesia Post Note  Patient: Michele Warren  Procedure(s) Performed: CESAREAN SECTION (N/A )     Patient location during evaluation: Mother Baby Anesthesia Type: Epidural Level of consciousness: awake and alert Pain management: pain level controlled Vital Signs Assessment: post-procedure vital signs reviewed and stable Respiratory status: spontaneous breathing Cardiovascular status: stable Postop Assessment: no headache, adequate PO intake, no backache, patient able to bend at knees, able to ambulate, epidural receding and no apparent nausea or vomiting Anesthetic complications: no    Last Vitals:  Vitals:   09/02/19 0220 09/02/19 0345  BP: 110/74   Pulse: 82   Resp: 18 17  Temp: 36.8 C 36.4 C  SpO2: 99% 97%    Last Pain:  Vitals:   09/02/19 0607  TempSrc:   PainSc: 0-No pain   Pain Goal:                   [name]

## 2019-09-02 NOTE — Progress Notes (Signed)
Subjective: Postpartum Day 1: Cesarean Delivery Patient reports tolerating PO and no problems voiding.    Objective: Vital signs in last 24 hours: Temp:  [97.4 F (36.3 C)-98.5 F (36.9 C)] 97.4 F (36.3 C) (11/24 0745) Pulse Rate:  [70-122] 70 (11/24 0745) Resp:  [14-21] 18 (11/24 0745) BP: (105-149)/(56-98) 115/81 (11/24 0745) SpO2:  [97 %-100 %] 100 % (11/24 0745)  Physical Exam:  General: alert, cooperative, appears stated age and no distress Lochia: appropriate Uterine Fundus: firm Incision: healing well DVT Evaluation: No evidence of DVT seen on physical exam.  Recent Labs    09/01/19 0754 09/02/19 0532  HGB 12.6 11.1*  HCT 38.1 33.8*    Assessment/Plan: Status post Cesarean section. Doing well postoperatively.  Continue current care Circ today.  Luz Lex 09/02/2019, 9:50 AM

## 2019-09-02 NOTE — Progress Notes (Signed)
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 CSW met with Michele Warren to offer support and complete assessment.    Michele Warren resting in bed with FOB present at bedside doing skin-to-skin with infant, when CSW entered the room. CSW introduced self and received verbal permission from Michele Warren to have FOB step out of the room while CSW completed assessment. FOB understanding and left voluntarily. Michele Warren observed to be very tired during assessment and reported so. CSW offered to come back at a later time but Michele Warren reported that CSW was ok to stay. CSW inquired about Michele Warren's history of PPD and Michele Warren acknowledged having "a little at first" after the birth of her first son in 2018. Michele Warren reported feeling sadness as she didn't get a break due to FOB's work schedule. Michele Warren reported that her mother was a great support for her and helped during this time. Michele Warren stated symptoms eventually "tapered down". Michele Warren shared that FOB's work schedule is better this time around and he will be able to help more. Michele Warren denied any previous mental health history before her PPD and denied receiving any medications or counseling. CSW provided education regarding the baby blues period vs. perinatal mood disorders, discussed treatment and gave resources for mental health follow up if concerns arise.  CSW recommends self-evaluation during the postpartum time period using the New Mom Checklist from Postpartum Progress and encouraged Michele Warren to contact a medical professional if symptoms are noted at any time. Michele Warren did not appear to be displaying any acute mental health symptoms and denied any current SI, HI or DV. Michele Warren reported having good support from FOB and her mother.   Michele Warren confirmed having all essential items for infant once discharged and reported infant would be sleeping in a bassinet once home. CSW provided review of Sudden Infant Death Syndrome (SIDS) precautions and safe sleeping habits.    CSW identifies no further need for intervention and no barriers to  discharge at this time.  Ashwin Tibbs, LCSWA  [facility] [phone]  

## 2019-09-02 NOTE — Lactation Note (Signed)
This note was copied from a baby's chart. Lactation Consultation Note  Patient Name: Michele Warren [id number] Date: 09/02/2019 Reason for consult: Follow-up assessment  P2 mother whose infant is now 85 hours old.    Mother had no questions/concerns related to breast feeding.  Baby was asleep in the bassinet when I arrived.  Mother is wearing her breast shells and has been able to easily hand express colostrum.  She is spoon feeding and using the curved tip syringe for supplementation.  Encouraged mother to continue feeding 8-12 times/24 hours or sooner if baby shows feeding cues.  She informed me that he is still very sleepy and I reassured her that this is typical for his age right now.  He will begin to awaken more after 24 hours.  Discussed ways to help awaken him when he is sleepy and to call for latch assistance as needed.    Maternal Data    Feeding Feeding Type: Breast Milk  LATCH Score                   Interventions    Lactation Tools Discussed/Used     Consult Status Consult Status: Follow-up Date: 09/03/19 Follow-up type: In-patient    Little Ishikawa 09/02/2019, 3:16 PM

## 2019-09-02 NOTE — Addendum Note (Signed)
Addendum  created 09/02/19 0732 by Genevie Ann, CRNA   Clinical Note Signed

## 2019-09-03 MED ORDER — IBUPROFEN 600 MG PO TABS: 600.0000 mg | ORAL_TABLET | Freq: Four times a day (QID) | ORAL | 0 refills | Status: AC | PRN

## 2019-09-03 NOTE — Progress Notes (Signed)
Subjective: Postpartum Day 2: Cesarean Delivery Patient reports tolerating PO and no problems voiding.    Objective: Vital signs in last 24 hours: Temp:  [97.4 F (36.3 C)-98 F (36.7 C)] 97.4 F (36.3 C) (11/25 0500) Pulse Rate:  [67-82] 77 (11/25 0500) Resp:  [16-20] 18 (11/25 0500) BP: (106-129)/(64-79) 110/73 (11/25 0500) SpO2:  [98 %-100 %] 99 % (11/25 0500)  Physical Exam:  General: alert, cooperative and appears stated age Lochia: appropriate Uterine Fundus: firm Incision: healing well, no significant drainage, no dehiscence DVT Evaluation: No evidence of DVT seen on physical exam. Negative Homan's sign. No cords or calf tenderness.  Recent Labs    09/01/19 0754 09/02/19 0532  HGB 12.6 11.1*  HCT 38.1 33.8*    Assessment/Plan: Status post Cesarean section. Doing well postoperatively.  Discharge home with standard precautions and return to clinic in 4-6 weeks.  Linda Hedges 09/03/2019, 8:14 AM

## 2019-09-03 NOTE — Discharge Summary (Signed)
Obstetric Discharge Summary Reason for Admission: induction of labor, TOLAC Prenatal Procedures: none Intrapartum Procedures: cesarean: low cervical, transverse (repeat) Postpartum Procedures: none Complications-Operative and Postpartum: none Hemoglobin  Date Value Ref Range Status  09/02/2019 11.1 (L) 12.0 - 15.0 g/dL Final   HCT  Date Value Ref Range Status  09/02/2019 33.8 (L) 36.0 - 46.0 % Final    Physical Exam:  General: alert, cooperative and appears stated age 33: appropriate Uterine Fundus: firm Incision: healing well, no significant drainage, no dehiscence DVT Evaluation: No evidence of DVT seen on physical exam. Negative Homan's sign. No cords or calf tenderness.  Discharge Diagnoses: Term Pregnancy-delivered  Discharge Information: Date: 09/03/2019 Activity: pelvic rest Diet: routine Medications: PNV, Ibuprofen and Ultram Condition: stable Instructions: refer to practice specific booklet Discharge to: home   Newborn Data: Live born female  Birth Weight: 7 lb 0.4 oz (3185 g) APGAR: 8, 9  Newborn Delivery   Birth date/time: 09/01/2020 21:16:00 Delivery type: C-Section, Vacuum Assisted Trial of labor: Yes C-section categorization: Repeat      Home with mother.  Linda Hedges 09/03/2019, 8:24 AM

## 2019-09-03 NOTE — Discharge Instructions (Signed)
Call MD for T>100.4, heavy vaginal bleeding, severe abdominal pain, intractable nausea and/or vomiting, or respiratory distress.  Call office to schedule postpartum visit in 6 weeks.  Pelvic rest x 6 weeks.  No driving while taking controlled substances.  Remove honeycomb dressing 3-7 days postop.

## 2019-09-03 NOTE — Lactation Note (Signed)
This note was copied from a baby's chart. Lactation Consultation Note  Patient Name: Boy Neeti Knudtson CZYSA'Y Date: 09/03/2019   Mom pumped & BO for 5 weeks with her 1st child & noted she had a good supply.   Mom is expressing her milk (with her hands) after infant receives donor milk. The lactation student & I assisted Mom with a different hand expression technique.  Infant has been receiving donor milk while being finger-fed with a curved-tip syringe. I offered to give Mom the milk in a bottle, instead & she agreed. I initially tried to use the yellow slow-flow nipple, but it was too fast for infant. I switched to the extra-slow flow nipple, which was somewhat better, but did require pacing.   I noted that infant's swallows still seemed somewhat loud & he may be experiencing a prolonged swallowing phase. After the bottle feeding was over, his respiratory rate seemed to be increased (despite pacing). There may have also been some mild congestion noted. Of note, infant's color did not change & there was no nasal flaring during the bottle feeding. There was also no coughing or choking.   Dr. Doneen Poisson was made aware & he gave permission for the SLP to do a full feeding evaluation.   I gave Leretha Dykes a full report & she will follow up at the next feeding. Mom knows to notify her RN when infant is ready to feed again. Matthias Hughs [facility] 09/03/2019, 10:21 AM

## 2019-09-05 LAB — SURGICAL PATHOLOGY

## 2019-09-07 ENCOUNTER — Other Ambulatory Visit: Payer: Self-pay

## 2019-09-07 ENCOUNTER — Encounter (HOSPITAL_COMMUNITY): Payer: Self-pay

## 2019-09-07 ENCOUNTER — Inpatient Hospital Stay (HOSPITAL_COMMUNITY)
Admission: AD | Admit: 2019-09-07 | Discharge: 2019-09-07 | Disposition: A | Discharge status: Home / Self Care | Payer: 59 | Attending: Obstetrics and Gynecology | Admitting: Obstetrics and Gynecology

## 2019-09-07 DIAGNOSIS — O99893 Other specified diseases and conditions complicating puerperium: Secondary | ICD-10-CM

## 2019-09-07 DIAGNOSIS — Z4889 Encounter for other specified surgical aftercare: Secondary | ICD-10-CM | POA: Insufficient documentation

## 2019-09-07 NOTE — MAU Provider Note (Signed)
History     CSN: [id number]  Arrival date and time: 09/07/19 1449   First Provider Initiated Contact with Patient 09/07/19 1528      Chief Complaint  Patient presents with  . Incisional Pain   Michele Warren is a 33 y.o. G3P2012 at 6 days postpartum from a Repeat C/S s/p  Failed TOLAC.  She receives care at [facility].  She presents today for Incisional Pain.  She states the pain started two days ago.  She reports she is having a sharp pinching sensation in one spot that she initially contributed to the honeycomb dressing.  However, she states that after removing the dressing she continued to have the pain and it is intensified with certain movements including walking.  She reports taking ibuprofen 600mg  and her last dose was yesterday around 9pm.  She states that the sharp pain stays despite ibuprofen dosing.    OB History    Gravida  3   Para  2   Term  2   Preterm      AB  1   Living  1     SAB  1   TAB      Ectopic      Multiple  0   Live Births  1           Past Medical History:  Diagnosis Date  . Allergy   . Anemia   . Headache     Past Surgical History:  Procedure Laterality Date  . CESAREAN SECTION N/A 08/15/2017   Procedure: CESAREAN SECTION;  Surgeon: 13/04/2017, DO;  Location: [facility];  Service: Obstetrics;  Laterality: N/A;  . CESAREAN SECTION N/A 09/01/2019   Procedure: CESAREAN SECTION;  Surgeon: 09/03/2019, MD;  Location: [facility];  Service: Obstetrics;  Laterality: N/A;  . CHOLECYSTECTOMY    . DILATION AND CURETTAGE OF UTERUS    . TONSILLECTOMY    . WISDOM TOOTH EXTRACTION      Family History  Problem Relation Age of Onset  . Cancer Maternal Grandmother   . Hypertension Maternal Grandfather   . Prostate cancer Maternal Uncle   . Diabetes Paternal Aunt   . Diabetes Paternal Uncle   . Pancreatic cancer Maternal Uncle     Social History   Tobacco Use  . Smoking status: Never Smoker  . Smokeless  tobacco: Never Used  Substance Use Topics  . Alcohol use: No  . Drug use: No    Allergies:  Allergies  Allergen Reactions  . Hydrocodone Anaphylaxis    Throat swells. Pt states she has tolerated oxycodone previously    Medications Prior to Admission  Medication Sig Dispense Refill Last Dose  . ibuprofen (ADVIL) 600 MG tablet Take 1 tablet (600 mg total) by mouth every 6 (six) hours as needed for moderate pain. 30 tablet 0 09/06/2019 at Unknown time  . Prenatal Vit-Fe Fumarate-FA (PRENATAL MULTIVITAMIN) TABS tablet Take 1 tablet daily at 12 noon by mouth.       Review of Systems  Constitutional: Negative for chills and fever.  Respiratory: Negative for cough and shortness of breath.   Gastrointestinal: Negative for constipation, diarrhea, nausea and vomiting.  Genitourinary: Positive for vaginal bleeding (Moderate to light). Negative for difficulty urinating, dysuria and pelvic pain.  Musculoskeletal: Positive for back pain.  Neurological: Negative for dizziness, light-headedness and headaches.   Physical Exam   Blood pressure 137/79, pulse 73, temperature 97.9 F (36.6 C), temperature source Oral, resp. rate  16, height 5' 9.5" (1.765 m), weight 125.8 kg, last menstrual period 09/22/2018, SpO2 100 %, unknown if currently breastfeeding.  Physical Exam  Constitutional: She is oriented to person, place, and time. No distress.  Obese  HENT:  Head: Normocephalic and atraumatic.  Eyes: Conjunctivae are normal.  Neck: Normal range of motion.  Cardiovascular: Normal rate.  Respiratory: Effort normal.  GI: Soft. There is abdominal tenderness in the right lower quadrant.  LT Incision: Steri Strips intact.  Appears well approximated.  No discharge or erythema.  No apparent tenderness to touch.    +Tenderness in RLQ lateral to end of incision.  No erythema noted.    Musculoskeletal: Normal range of motion.        General: Edema present.  Neurological: She is alert and oriented to  person, place, and time.  Skin: Skin is warm and dry.  Psychiatric: She has a normal mood and affect. Her behavior is normal.    MAU Course  Procedures  MDM Physical Exam Education  Assessment and Plan  33 year old Postpartum State Wound Check  -Exam performed and findings discussed. -Patient informed that area of discomfort/pain is near incision, but not of actual incisional site. -Extensive discussion regarding expectations after "Major Abdominal Surgery" I.e C/S.  Reviewed anticipated limitations to movement, caring of self and others, as well as expected healing times. -Reviewed usage of pain medication to assist with aches and pains associated with recent surgery and delivery. -Discussed s/s of infection and concern within the postpartum and post-op setting including pain, fever, incisional drainage/smell, and increased bleeding.  -Patient verbalizes understanding and is without questions or concerns. -Instructed to contact primary ob if any other issues arise for guidance and/or reassurance. -Encouraged to return to MAU if symptoms worsen or with the onset of new symptoms. -Discharged to home in stable condition.   Maryann Conners MSN, CNM 09/07/2019, 3:28 PM

## 2019-09-07 NOTE — MAU Note (Signed)
Michele Warren is a 33 y.o. here in MAU reporting: s/p c/s on 11/23. 2 days ago she started feeling a sharp, pinching pain on the right side of her incision. States she thought it might be related to the dressing but the honeycomb and 2 steristrips came off yesterday and she is still having the pain. States as far as she has been able to tell the incision looks like it has been healing well. Pain is only with movement.  Onset of complaint: 2 days  Pain score: 8/10  Vitals:   09/07/19 1507  BP: 137/79  Pulse: 73  Resp: 16  Temp: 97.9 F (36.6 C)  SpO2: 100%     Lab orders placed from triage: none

## 2019-09-07 NOTE — Discharge Instructions (Signed)

## 2019-09-15 ENCOUNTER — Encounter (HOSPITAL_COMMUNITY): Payer: Self-pay | Admitting: Obstetrics and Gynecology

## 2019-09-15 NOTE — Addendum Note (Signed)
Addendum  created 09/15/19 1547 by Barnet Glasgow, MD   Intraprocedure Event edited

## 2020-02-17 ENCOUNTER — Encounter (HOSPITAL_COMMUNITY): Payer: Self-pay | Admitting: *Deleted

## 2020-02-17 ENCOUNTER — Emergency Department (HOSPITAL_COMMUNITY)
Admission: EM | Admit: 2020-02-17 | Discharge: 2020-02-17 | Disposition: A | Discharge status: Home / Self Care | Payer: BC Managed Care – PPO | Attending: Emergency Medicine | Admitting: Emergency Medicine

## 2020-02-17 ENCOUNTER — Emergency Department (HOSPITAL_COMMUNITY): Payer: BC Managed Care – PPO

## 2020-02-17 ENCOUNTER — Other Ambulatory Visit: Payer: Self-pay

## 2020-02-17 DIAGNOSIS — K5900 Constipation, unspecified: Secondary | ICD-10-CM | POA: Diagnosis not present

## 2020-02-17 DIAGNOSIS — R1011 Right upper quadrant pain: Secondary | ICD-10-CM | POA: Diagnosis present

## 2020-02-17 DIAGNOSIS — N323 Diverticulum of bladder: Secondary | ICD-10-CM | POA: Insufficient documentation

## 2020-02-17 DIAGNOSIS — Z79899 Other long term (current) drug therapy: Secondary | ICD-10-CM | POA: Insufficient documentation

## 2020-02-17 LAB — URINALYSIS, ROUTINE W REFLEX MICROSCOPIC
Bacteria, UA: NONE SEEN
Bilirubin Urine: NEGATIVE
Glucose, UA: NEGATIVE mg/dL
Ketones, ur: NEGATIVE mg/dL
Nitrite: NEGATIVE
Protein, ur: NEGATIVE mg/dL
Specific Gravity, Urine: 1.024 (ref 1.005–1.030)
pH: 6 (ref 5.0–8.0)

## 2020-02-17 LAB — COMPREHENSIVE METABOLIC PANEL
ALT: 10 U/L (ref 0–44)
AST: 15 U/L (ref 15–41)
Albumin: 3.7 g/dL (ref 3.5–5.0)
Alkaline Phosphatase: 62 U/L (ref 38–126)
Anion gap: 8 (ref 5–15)
BUN: 10 mg/dL (ref 6–20)
CO2: 25 mmol/L (ref 22–32)
Calcium: 8.9 mg/dL (ref 8.9–10.3)
Chloride: 108 mmol/L (ref 98–111)
Creatinine, Ser: 0.82 mg/dL (ref 0.44–1.00)
GFR calc Af Amer: >60 mL/min (ref >60)
GFR calc non Af Amer: >60 mL/min (ref >60)
Glucose, Bld: 93 mg/dL (ref 70–99)
Potassium: 4 mmol/L (ref 3.5–5.1)
Sodium: 141 mmol/L (ref 135–145)
Total Bilirubin: 0.8 mg/dL (ref 0.3–1.2)
Total Protein: 6.8 g/dL (ref 6.5–8.1)

## 2020-02-17 LAB — CBC
HCT: 38.4 % (ref 36.0–46.0)
Hemoglobin: 12.1 g/dL (ref 12.0–15.0)
MCH: 22 pg — ABNORMAL LOW (ref 26.0–34.0)
MCHC: 31.5 g/dL (ref 30.0–36.0)
MCV: 69.9 fL — ABNORMAL LOW (ref 80.0–100.0)
Platelets: 310 10*3/uL (ref 150–400)
RBC: 5.49 MIL/uL — ABNORMAL HIGH (ref 3.87–5.11)
RDW: 16.3 % — ABNORMAL HIGH (ref 11.5–15.5)
WBC: 8.4 10*3/uL (ref 4.0–10.5)
nRBC: 0 % (ref 0.0–0.2)

## 2020-02-17 LAB — I-STAT BETA HCG BLOOD, ED (MC, WL, AP ONLY): I-stat hCG, quantitative: <5 m[IU]/mL (ref <5)

## 2020-02-17 LAB — LIPASE, BLOOD: Lipase: 40 U/L (ref 11–51)

## 2020-02-17 MED ORDER — FENTANYL CITRATE (PF) 100 MCG/2ML IJ SOLN
50.0000 ug | Freq: Once | INTRAMUSCULAR | Status: AC
  Administered 2020-02-17: 50 ug via INTRAVENOUS
  Filled 2020-02-17: qty 2

## 2020-02-17 MED ORDER — SODIUM CHLORIDE 0.9% FLUSH: 3.0000 mL | Freq: Once | INTRAVENOUS | Status: DC

## 2020-02-17 MED ORDER — SENNOSIDES-DOCUSATE SODIUM 8.6-50 MG PO TABS
ORAL_TABLET | ORAL | 0 refills | Status: DC
Start: 2020-02-17 — End: 2021-05-10

## 2020-02-17 MED ORDER — ONDANSETRON HCL 4 MG/2ML IJ SOLN
4.0000 mg | Freq: Once | INTRAMUSCULAR | Status: AC
  Administered 2020-02-17: 4 mg via INTRAVENOUS
  Filled 2020-02-17: qty 2

## 2020-02-17 MED ORDER — DICYCLOMINE HCL 20 MG PO TABS
20.0000 mg | ORAL_TABLET | Freq: Two times a day (BID) | ORAL | 0 refills | Status: DC
Start: 2020-02-17 — End: 2021-05-10

## 2020-02-17 NOTE — ED Triage Notes (Signed)
Pt c/o back pain radiating to RUQ, sudden onset at 2pm. Endorses nausea, no vomiting. Took tylenol without improvement Denies urinary issues

## 2020-02-17 NOTE — ED Notes (Signed)
Patient given discharge instructions patient verbalizes understanding. 

## 2020-02-17 NOTE — Discharge Instructions (Addendum)
1) your CT scan showed a large volume of stool in the region where you are having pain.  This may be the cause of the discomfort you are having today.  Going to treat you with a medication that prevents: Spasm the pain associated with it and a laxative.  If you continue to have severe pain you may need to follow closely with a gastroenterologist.  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  This is an outpouching from the bladder.  These are frequently asymptomatic however they can produce symptoms of pain with intercourse, bloody urine, pain with urination and recurrent urinary tract infections.  If you are having these issues you should follow closely with your OB/GYN or primary care doctor.  SEEK IMMEDIATE MEDICAL ATTENTION IF: The pain does not go away or becomes severe.  A temperature above 101 develops.  Repeated vomiting occurs (multiple episodes).  The pain becomes localized to portions of the abdomen. The right side could possibly be appendicitis. In an adult, the left lower portion of the abdomen could be colitis or diverticulitis.  Blood is being passed in stools or vomit (bright red or black tarry stools).  Return also if you develop chest pain, difficulty breathing, dizziness or fainting, or become confused, poorly responsive, or inconsolable (young children).

## 2020-02-17 NOTE — ED Provider Notes (Signed)
[facility] EMERGENCY DEPARTMENT Provider Note   CSN: [id number] Arrival date & time: 02/17/20  0225     History Chief Complaint  Patient presents with  . Abdominal Pain    Michele Warren is a 34 y.o. female Onset of Abdominal pain yesterday around 2 pm. Started in the R upper back and began radiating around to the RUQ. Went to the chiropractor and did not get relief. Also took tylenol 3 without relief. She has had mild nausea without vomiting. Pain is achy and colicky. No aggravating or alleviating factors. She states that it feel like previous gallstones. She is s/p cholecystectomy. Cannot find a position of comfort, pain is worse with deep breathing.     HPI     Past Medical History:  Diagnosis Date  . Allergy   . Anemia   . Headache     Patient Active Problem List   Diagnosis Date Noted  . Term pregnancy 09/01/2019  . Pregnancy 08/15/2017  . S/P cesarean section 08/15/2017    Past Surgical History:  Procedure Laterality Date  . CESAREAN SECTION N/A 08/15/2017   Procedure: CESAREAN SECTION;  Surgeon: Mitchel Honour, DO;  Location: [facility];  Service: Obstetrics;  Laterality: N/A;  . CESAREAN SECTION N/A 09/01/2019   Procedure: CESAREAN SECTION;  Surgeon: Harold Hedge, MD;  Location: [facility];  Service: Obstetrics;  Laterality: N/A;  . CHOLECYSTECTOMY    . DILATION AND CURETTAGE OF UTERUS    . TONSILLECTOMY    . WISDOM TOOTH EXTRACTION       OB History    Gravida  3   Para  2   Term  2   Preterm      AB  1   Living  1     SAB  1   TAB      Ectopic      Multiple  0   Live Births  1           Family History  Problem Relation Age of Onset  . Cancer Maternal Grandmother   . Hypertension Maternal Grandfather   . Prostate cancer Maternal Uncle   . Diabetes Paternal Aunt   . Diabetes Paternal Uncle   . Pancreatic cancer Maternal Uncle     Social History   Tobacco Use  . Smoking status: Never Smoker    . Smokeless tobacco: Never Used  Substance Use Topics  . Alcohol use: No  . Drug use: No    Home Medications Prior to Admission medications   Medication Sig Start Date End Date Taking? Authorizing Provider  ibuprofen (ADVIL) 600 MG tablet Take 1 tablet (600 mg total) by mouth every 6 (six) hours as needed for moderate pain. 09/03/19   Mitchel Honour, DO  Prenatal Vit-Fe Fumarate-FA (PRENATAL MULTIVITAMIN) TABS tablet Take 1 tablet daily at 12 noon by mouth.    [provider]    Allergies    Hydrocodone  Review of Systems   Review of Systems Ten systems reviewed and are negative for acute change, except as noted in the HPI.   Physical Exam Updated Vital Signs BP (!) 141/84 (BP Location: Right Arm)   Pulse 80   Temp 98 F (36.7 C) (Oral)   Resp 16   LMP 02/16/2020   SpO2 97%   Physical Exam Vitals and nursing note reviewed.  Constitutional:      General: She is not in acute distress.    Appearance: She is well-developed. She  is not diaphoretic.  HENT:     Head: Normocephalic and atraumatic.  Eyes:     General: No scleral icterus.    Conjunctiva/sclera: Conjunctivae normal.  Cardiovascular:     Rate and Rhythm: Normal rate and regular rhythm.     Heart sounds: Normal heart sounds. No murmur. No friction rub. No gallop.   Pulmonary:     Effort: Pulmonary effort is normal. No respiratory distress.     Breath sounds: Normal breath sounds.  Abdominal:     General: Bowel sounds are normal. There is no distension.     Palpations: Abdomen is soft. There is no mass.     Tenderness: There is abdominal tenderness in the right upper quadrant and epigastric area. There is right CVA tenderness. There is no left CVA tenderness or guarding.  Musculoskeletal:     Cervical back: Normal range of motion.  Skin:    General: Skin is warm and dry.  Neurological:     Mental Status: She is alert and oriented to person, place, and time.  Psychiatric:        Behavior: Behavior  normal.     ED Results / Procedures / Treatments   Labs (all labs ordered are listed, but only abnormal results are displayed) Labs Reviewed  CBC - Abnormal; Notable for the following components:      Result Value   RBC 5.49 (*)    MCV 69.9 (*)    MCH 22.0 (*)    RDW 16.3 (*)    All other components within normal limits  URINALYSIS, ROUTINE W REFLEX MICROSCOPIC - Abnormal; Notable for the following components:   Hgb urine dipstick LARGE (*)    Leukocytes,Ua TRACE (*)    All other components within normal limits  LIPASE, BLOOD  COMPREHENSIVE METABOLIC PANEL  I-STAT BETA HCG BLOOD, ED ([facility], [facility], [facility] ONLY)    EKG None  Radiology No results found.  Procedures Procedures (including critical care time)  Medications Ordered in ED Medications  sodium chloride flush (NS) 0.9 % injection 3 mL (has no administration in time range)    ED Course  I have reviewed the triage vital signs and the nursing notes.  Pertinent labs & imaging results that were available during my care of the patient were reviewed by me and considered in my medical decision making (see chart for details).  Clinical Course as of Feb 16 713  Tue Feb 17, 2020  0712 Patient is menstruating  Hgb urine dipstick(!): LARGE [AH]    Clinical Course User Index [AH] Margarita Mail, PA-C   MDM Rules/Calculators/A&P                      34 year old female here with complaint of right upper quadrant abdominal pain. The emergent DDX for RUQ pain includes but is not limited to Glabladder disease, PUD, Acute Hepatitis, Pancreatitis, pyelonephritis, Pneumonia, Lower lobe PE/Infarct, Kidney stone, GERD, retrocecal appendicitis, Fitz-Hugh-Curtis syndrome, AAA, MI, Zoster. I personally ordered interpreted and reviewed the patient's labs which shows negative hCG, lipase within normal limits, urine with contamination from menstruation.  CBC shows no acute abnormalities.  CMP without abnormality.  History gathered from patient  and review of EMR.  I ordered a CT renal stone study and personally reviewed and interpreted the images.  Patient has a large stool burden and redundant colon with pain in the region of large stool burden.  I suspect that her pain is secondary to constipation and will treat as  such.  She has benign exam and benign labs.  She is status post cholecystectomy.  I suppose she could have a choledocholithiasis however her labs are not reflective of this and I do not think she needs emergent MRCP.  We will treat conservatively with the measures described and have her follow-up with GI specialist should she not improve.  I discussed return precautions.  She was appropriate for discharge at this time.    Final Clinical Impression(s) / ED Diagnoses Final diagnoses:  None    Rx / DC Orders ED Discharge Orders    None       Arthor Captain, PA-C 02/17/20 1555    Virgina Norfolk, DO 02/20/20 1630

## 2020-02-17 NOTE — ED Notes (Signed)
This RN and P[employer] scheme manager wasted of fent in sharps container in [place].

## 2020-02-17 NOTE — ED Notes (Signed)
This RN witnessed the waste of Fentanyl by Skeet Simmer, RN into a sharps container.

## 2020-07-26 IMAGING — US US OB TRANSVAGINAL
1 series · 15 of 28 positions shown · non-contrast
Comparison: None.

CLINICAL DATA: Pregnant patient with abdominal pain and vaginal
bleeding.

EXAM:
OBSTETRIC <14 WK US AND TRANSVAGINAL OB US
TECHNIQUE: Both transabdominal and transvaginal ultrasound examinations were
performed for complete evaluation of the gestation as well as the
maternal uterus, adnexal regions, and pelvic cul-de-sac.
Transvaginal technique was performed to assess early pregnancy.

[Series 1: us ob transvaginal · 15 of 48 slices shown]
[im 1/48]
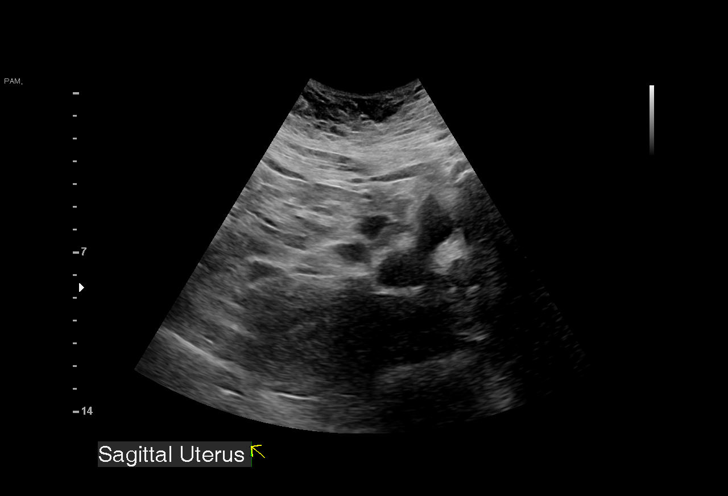
[im 4/48]
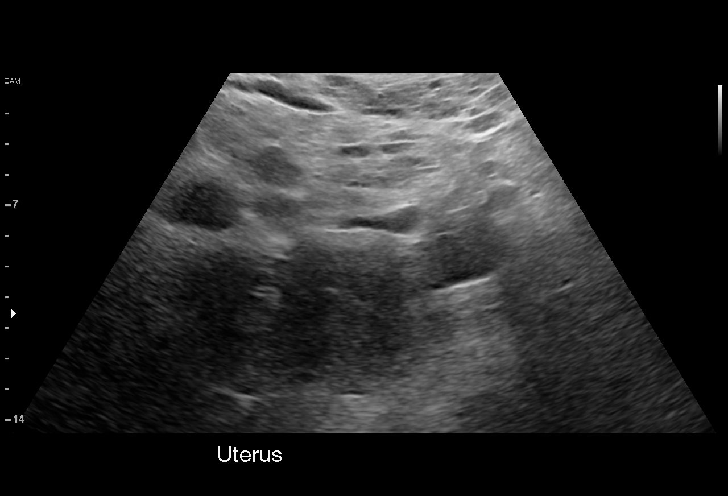
[im 7/48]
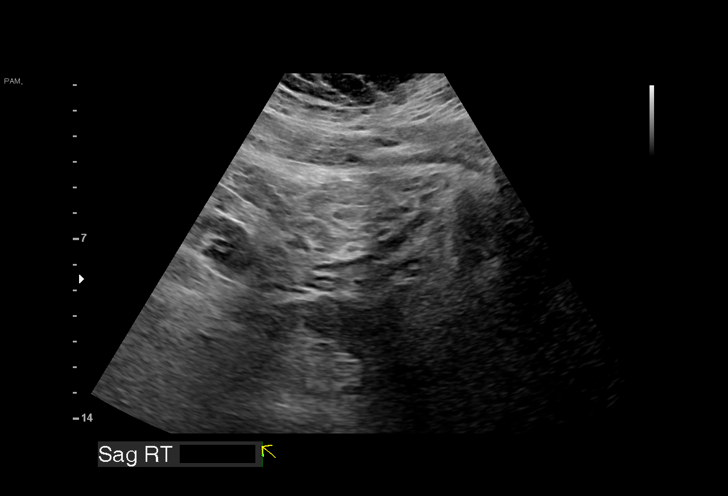
[im 11/48]
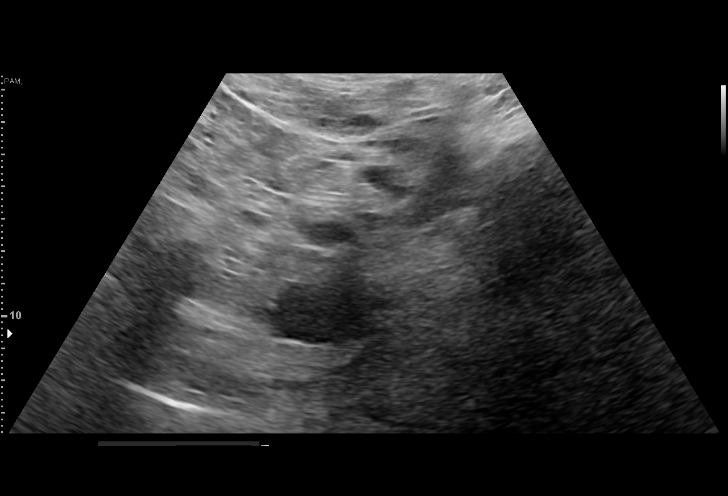
[im 14/48]
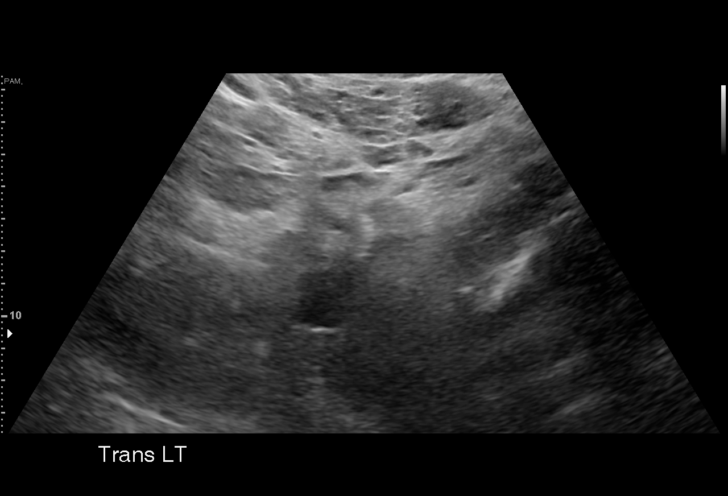
[im 18/48]
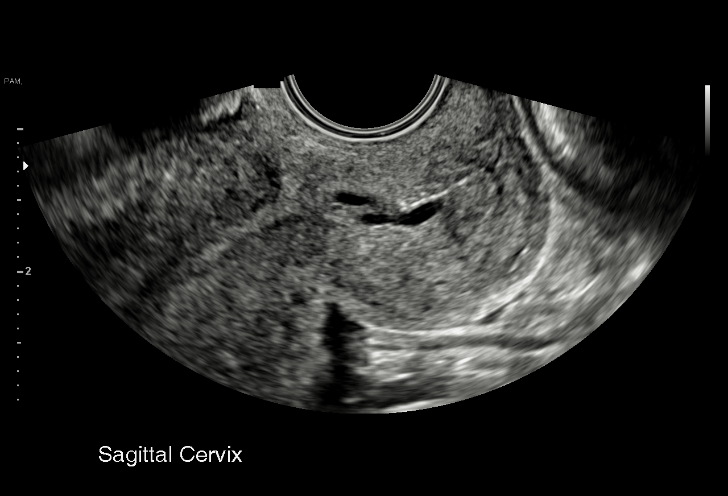
[im 21/48]
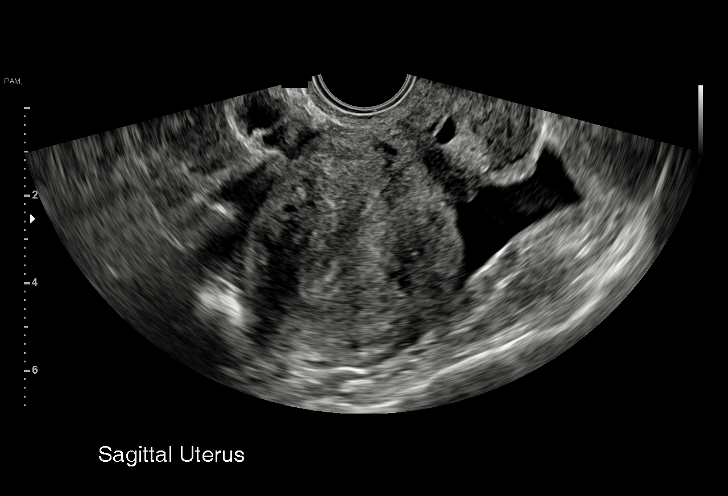
[im 25/48]
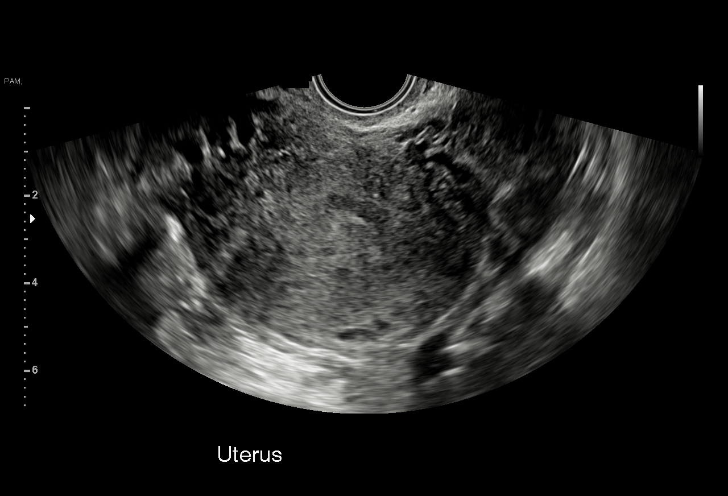
[im 27/48]
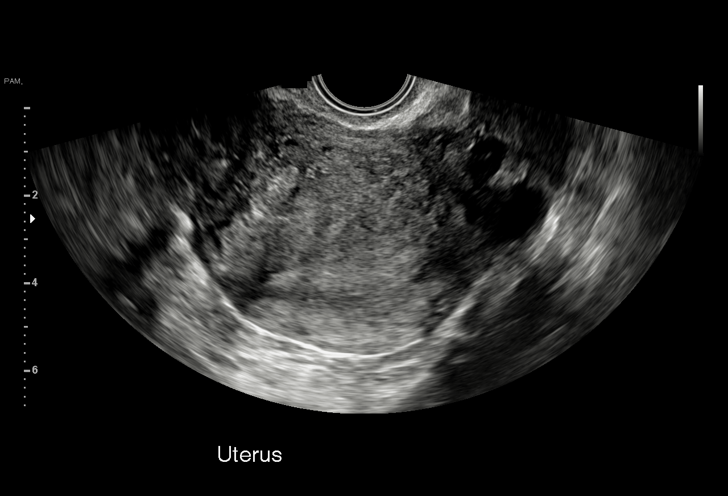
[im 30/48]
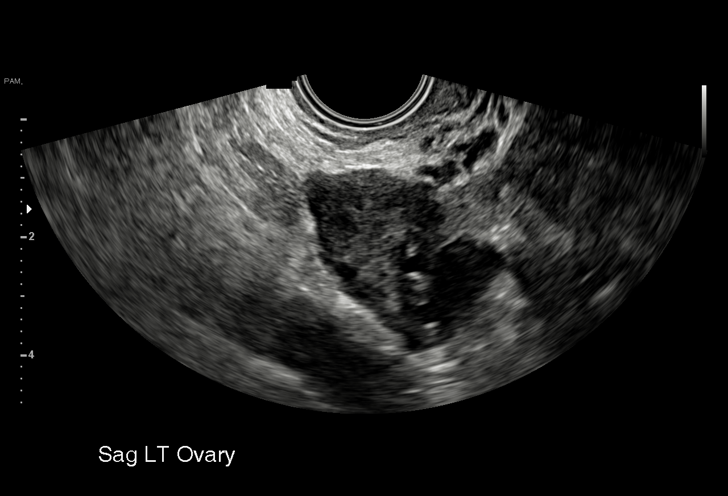
[im 34/48]
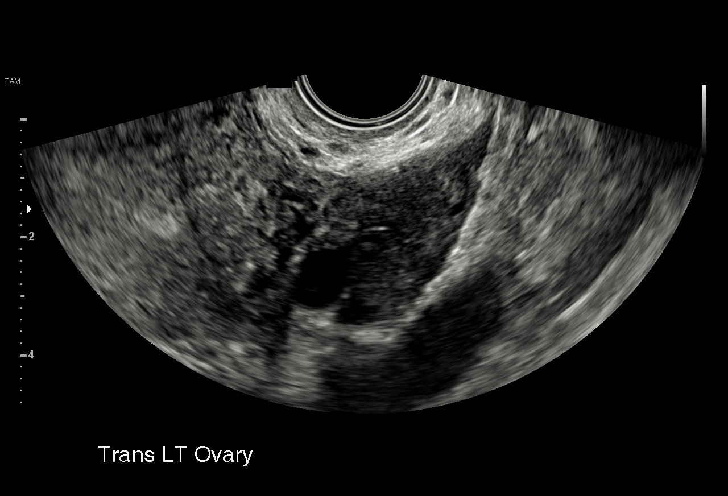
[im 37/48]
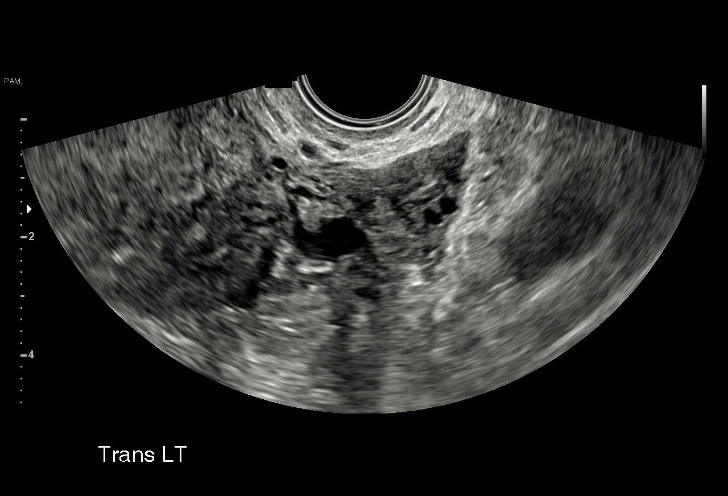
[im 41/48]
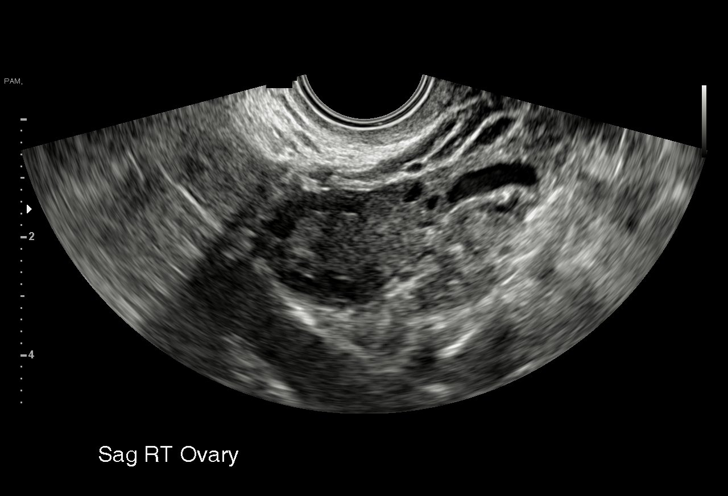
[im 44/48]
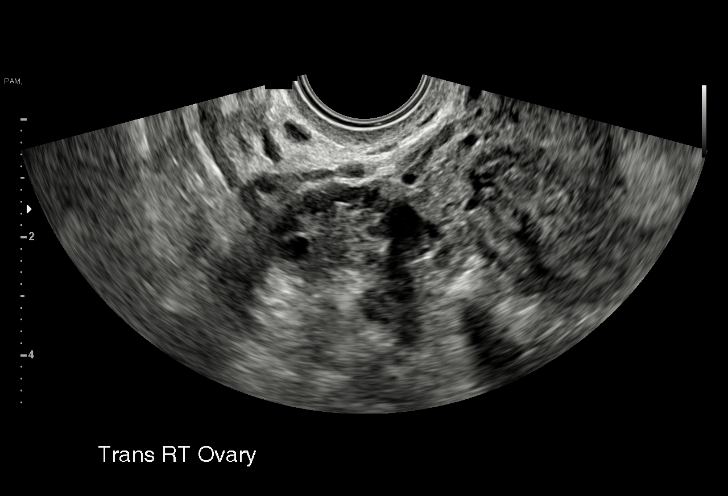
[im 48/48]
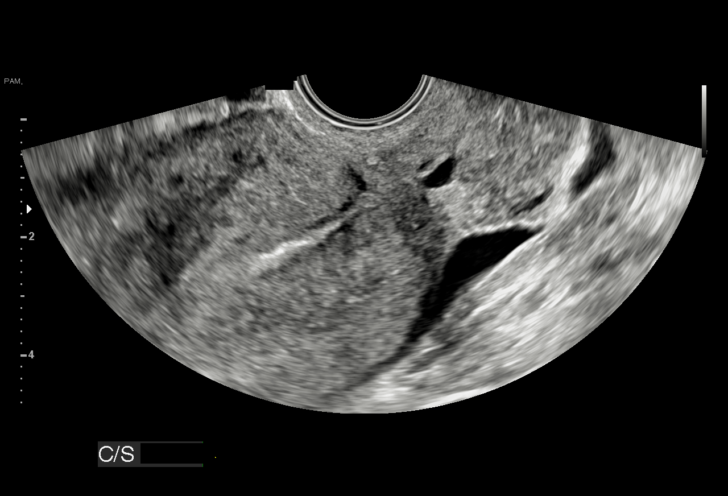

[15 of 28 positions shown; findings below may reference images not displayed]

FINDINGS: Intrauterine gestational sac: None

Yolk sac:  Not Visualized.

Embryo:  Not Visualized.

Cardiac Activity: Not Visualized.

Maternal uterus/adnexae: Normal right and left ovaries. Small amount
of free fluid in the pelvis.
IMPRESSION: No intrauterine gestation identified. In the setting of positive
pregnancy test and no definite intrauterine pregnancy, this reflects
a pregnancy of unknown location. Differential considerations include
early normal IUP, abnormal IUP, or nonvisualized ectopic pregnancy.
Differentiation is achieved with serial beta HCG supplemented by
repeat sonography as clinically warranted.

## 2020-11-30 ENCOUNTER — Other Ambulatory Visit: Payer: Self-pay

## 2020-11-30 ENCOUNTER — Ambulatory Visit: Payer: Self-pay

## 2020-11-30 ENCOUNTER — Ambulatory Visit (INDEPENDENT_AMBULATORY_CARE_PROVIDER_SITE_OTHER): Payer: BC Managed Care – PPO | Admitting: Orthopaedic Surgery

## 2020-11-30 ENCOUNTER — Encounter: Payer: Self-pay | Admitting: Orthopaedic Surgery

## 2020-11-30 VITALS — BP 124/85 | HR 88 | Ht 70.0 in | Wt 280.0 lb

## 2020-11-30 DIAGNOSIS — M25552 Pain in left hip: Secondary | ICD-10-CM

## 2020-11-30 DIAGNOSIS — G8929 Other chronic pain: Secondary | ICD-10-CM | POA: Diagnosis not present

## 2020-11-30 DIAGNOSIS — M25551 Pain in right hip: Secondary | ICD-10-CM | POA: Diagnosis not present

## 2020-11-30 DIAGNOSIS — M7062 Trochanteric bursitis, left hip: Secondary | ICD-10-CM | POA: Diagnosis not present

## 2020-11-30 DIAGNOSIS — M545 Low back pain, unspecified: Secondary | ICD-10-CM | POA: Diagnosis not present

## 2020-11-30 NOTE — Progress Notes (Signed)
Office Visit Note   Patient: Michele Warren           Date of Birth: [birth year]           MRN: [id number] Visit Date: 11/30/2020              Requested by: Richarda Overlie, MD [address] [place],  [place] [address] PCP: Richarda Overlie, MD   Assessment & Plan: Visit Diagnoses:  1. Chronic bilateral low back pain, unspecified whether sciatica present   2. Bilateral hip pain     Plan: Trochanteric injection performed left side with good relief.  She can follow-up if she has persistent symptoms.  Radiograph results were reviewed and discussed.  Pathophysiology discussed.  Follow-Up Instructions: No follow-ups on file.   Orders:  Orders Placed This Encounter  Procedures  . XR Lumbar Spine 2-3 Views  . XR Pelvis 1-2 Views   No orders of the defined types were placed in this encounter.     Procedures: Large Joint Inj: L greater trochanter on 11/30/2020 3:59 PM Details: 22 G 1.5 in needle, lateral approach Medications: 40 mg methylPREDNISolone acetate 40 MG/ML      Clinical Data: No additional findings.   Subjective: Chief Complaint  Patient presents with  . Lower Back - Pain  . Left Hip - Pain  . Right Hip - Pain    HPI 35 year old female with some back pain buttocks pain pain near her coccyx.  She states her pain seems to be worse with walking.  She fell 2020 on the steps when she was pregnant.  She describes a hematoma that was present on her buttocks which improved.  She notices pain with sitting difficulty getting from sitting standing when she is up she is better.  She denies numbness or tingling in her feet.  She has been trying to be active and work on weight loss with her morbid obesity BMI 40 and weight 280.  Review of Systems previous pregnancy with term C-section.  No associated bowel bladder symptoms.  She denies neurogenic claudication symptoms.  Negative cardiovascular respiratory pelvic.  All the systems are noncontributory  HPI.   Objective: Vital Signs: BP 124/85   Pulse 88   Ht 5\' 10"  (1.778 m)   Wt 280 lb (127 kg)   BMI 40.18 kg/m   Physical Exam Constitutional:      Appearance: She is well-developed.  HENT:     Head: Normocephalic.     Right Ear: External ear normal.     Left Ear: External ear normal.  Eyes:     Pupils: Pupils are equal, round, and reactive to light.  Neck:     Thyroid: No thyromegaly.     Trachea: No tracheal deviation.  Cardiovascular:     Rate and Rhythm: Normal rate.  Pulmonary:     Effort: Pulmonary effort is normal.  Abdominal:     Palpations: Abdomen is soft.  Skin:    General: Skin is warm and dry.  Neurological:     Mental Status: She is alert and oriented to person, place, and time.  Psychiatric:        Mood and Affect: Mood and affect normal.        Behavior: Behavior normal.     Ortho Exam patient has some tenderness over the lower sacrum and coccyx.  No sciatic notch tenderness.  Some tenderness over the SI joint on the left.  Significant tenderness of the left greater trochanter negative  on the right.  Negative logroll the hips knee and ankle jerk 2+ and symmetrical anterior tib EHL gastrocsoleus toe extension flexion is normal.  Normal distal pulses.  Specialty Comments:  No specialty comments available.  Imaging: No results found.   PMFS History: Patient Active Problem List   Diagnosis Date Noted  . Term pregnancy 09/01/2019  . Pregnancy 08/15/2017  . S/P cesarean section 08/15/2017   Past Medical History:  Diagnosis Date  . Allergy   . Anemia   . Headache     Family History  Problem Relation Age of Onset  . Cancer Maternal Grandmother   . Hypertension Maternal Grandfather   . Prostate cancer Maternal Uncle   . Diabetes Paternal Aunt   . Diabetes Paternal Uncle   . Pancreatic cancer Maternal Uncle     Past Surgical History:  Procedure Laterality Date  . CESAREAN SECTION N/A 08/15/2017   Procedure: CESAREAN SECTION;  Surgeon:  Mitchel Honour, DO;  Location: [facility];  Service: Obstetrics;  Laterality: N/A;  . CESAREAN SECTION N/A 09/01/2019   Procedure: CESAREAN SECTION;  Surgeon: Harold Hedge, MD;  Location: [facility];  Service: Obstetrics;  Laterality: N/A;  . CHOLECYSTECTOMY    . DILATION AND CURETTAGE OF UTERUS    . TONSILLECTOMY    . WISDOM TOOTH EXTRACTION     Social History   Occupational History  . Not on file  Tobacco Use  . Smoking status: Never Smoker  . Smokeless tobacco: Never Used  Substance and Sexual Activity  . Alcohol use: No  . Drug use: No  . Sexual activity: Not Currently    Birth control/protection: None

## 2020-12-06 DIAGNOSIS — M7062 Trochanteric bursitis, left hip: Secondary | ICD-10-CM | POA: Insufficient documentation

## 2020-12-06 MED ORDER — METHYLPREDNISOLONE ACETATE 40 MG/ML IJ SUSP
40.0000 mg | INTRAMUSCULAR | Status: AC | PRN
Start: 2020-11-30 — End: 2020-11-30
  Administered 2020-11-30: 40 mg via INTRA_ARTICULAR

## 2020-12-29 ENCOUNTER — Encounter: Payer: Self-pay | Admitting: Orthopaedic Surgery

## 2020-12-29 ENCOUNTER — Ambulatory Visit (INDEPENDENT_AMBULATORY_CARE_PROVIDER_SITE_OTHER): Payer: BC Managed Care – PPO | Admitting: Orthopaedic Surgery

## 2020-12-29 ENCOUNTER — Ambulatory Visit: Payer: Self-pay

## 2020-12-29 VITALS — BP 130/83 | HR 78 | Ht 70.0 in | Wt 280.0 lb

## 2020-12-29 DIAGNOSIS — M533 Sacrococcygeal disorders, not elsewhere classified: Secondary | ICD-10-CM | POA: Diagnosis not present

## 2020-12-29 DIAGNOSIS — G8929 Other chronic pain: Secondary | ICD-10-CM | POA: Diagnosis not present

## 2020-12-29 DIAGNOSIS — M545 Low back pain, unspecified: Secondary | ICD-10-CM

## 2020-12-29 MED ORDER — LIDOCAINE HCL 1 % IJ SOLN
0.3000 mL | INTRAMUSCULAR | Status: AC | PRN
  Administered 2020-12-29: .3 mL

## 2020-12-29 MED ORDER — BUPIVACAINE HCL 0.25 % IJ SOLN
0.3300 mL | INTRAMUSCULAR | Status: AC | PRN
  Administered 2020-12-29: .33 mL via INTRA_ARTICULAR

## 2020-12-29 MED ORDER — METHYLPREDNISOLONE ACETATE 40 MG/ML IJ SUSP
20.0000 mg | INTRAMUSCULAR | Status: AC | PRN
  Administered 2020-12-29: 20 mg via INTRA_ARTICULAR

## 2020-12-29 NOTE — Progress Notes (Signed)
Office Visit Note   Patient: Michele Warren           Date of Birth: 02-06-1986           MRN: [id number] Visit Date: 12/29/2020              Requested by: Richarda Overlie, MD [address] [place],  [place] [address] PCP: Richarda Overlie, MD   Assessment & Plan: Visit Diagnoses:  1. Chronic bilateral low back pain, unspecified whether sciatica present   2. Coccydynia     Plan: Coccyx injection performed in December she sat down she states she got good relief.  She will return if she has persistent problems.  Follow-Up Instructions: Return if symptoms worsen or fail to improve.   Orders:  Orders Placed This Encounter  Procedures  . Small Joint Inj  . XR Sacrum/Coccyx   No orders of the defined types were placed in this encounter.     Procedures: Small Joint Inj (coccyx) on 12/29/2020 8:51 PM Medications: 0.3 mL lidocaine 1 %; 0.33 mL bupivacaine 0.25 %; 20 mg methylPREDNISolone acetate 40 MG/ML   Female form our office  present for coccyx injection prepping positioning and injection.  Patient tolerated the injection well and reported good relief.   Clinical Data: No additional findings.   Subjective: Chief Complaint  Patient presents with  . Tailbone Pain    HPI 35 year old female returns with ongoing problems with coccyx pain.  She has a 44-year-old and 68-year-old had a fall when she was pregnant and has had persistent symptoms.  She had a trochanteric injection and got good relief from this but states she still has trouble sitting she has to shift to the side she stands at work with a work bench because she has pain with sitting bothers her on a daily basis hard chair she cannot sit in.  She states pain has never really gotten better she not take any pain medication anti-inflammatories coccyx pillow using a small pillow underneath the buttocks has not been successful.  Review of Systems updated unchanged from last office visit all other  systems.   Objective: Vital Signs: BP 130/83   Pulse 78   Ht 5\' 10"  (1.778 m)   Wt 280 lb (127 kg)   BMI 40.18 kg/m   Physical Exam Constitutional:      Appearance: She is well-developed.  HENT:     Head: Normocephalic.     Right Ear: External ear normal.     Left Ear: External ear normal.  Eyes:     Pupils: Pupils are equal, round, and reactive to light.  Neck:     Thyroid: No thyromegaly.     Trachea: No tracheal deviation.  Cardiovascular:     Rate and Rhythm: Normal rate.  Pulmonary:     Effort: Pulmonary effort is normal.  Abdominal:     Palpations: Abdomen is soft.  Skin:    General: Skin is warm and dry.  Neurological:     Mental Status: She is alert and oriented to person, place, and time.  Psychiatric:        Behavior: Behavior normal.     Ortho Exam patient has significant pain with palpation over C2-C3.  No palpable mobility.  Tip of the coccyx is minimally tender.  No sciatic notch tenderness logroll's are negative trochanteric bursa is no longer tender.  Specialty Comments:  No specialty comments available.  Imaging: XR Sacrum/Coccyx  Result Date: 12/29/2020 AP lateral sacrum  coccyx is obtained and reviewed.  This shows no evidence of previous coccyx fracture. Impression: Normal sacrum and coccyx.    PMFS History: Patient Active Problem List   Diagnosis Date Noted  . Coccydynia 12/29/2020  . Trochanteric bursitis, left hip 12/06/2020  . Term pregnancy 09/01/2019  . Pregnancy 08/15/2017  . S/P cesarean section 08/15/2017   Past Medical History:  Diagnosis Date  . Allergy   . Anemia   . Headache     Family History  Problem Relation Age of Onset  . Cancer Maternal Grandmother   . Hypertension Maternal Grandfather   . Prostate cancer Maternal Uncle   . Diabetes Paternal Aunt   . Diabetes Paternal Uncle   . Pancreatic cancer Maternal Uncle     Past Surgical History:  Procedure Laterality Date  . CESAREAN SECTION N/A 08/15/2017    Procedure: CESAREAN SECTION;  Surgeon: Mitchel Honour, DO;  Location: [facility];  Service: Obstetrics;  Laterality: N/A;  . CESAREAN SECTION N/A 09/01/2019   Procedure: CESAREAN SECTION;  Surgeon: Harold Hedge, MD;  Location: [facility];  Service: Obstetrics;  Laterality: N/A;  . CHOLECYSTECTOMY    . DILATION AND CURETTAGE OF UTERUS    . TONSILLECTOMY    . WISDOM TOOTH EXTRACTION     Social History   Occupational History  . Not on file  Tobacco Use  . Smoking status: Never Smoker  . Smokeless tobacco: Never Used  Substance and Sexual Activity  . Alcohol use: No  . Drug use: No  . Sexual activity: Not Currently    Birth control/protection: None

## 2021-01-18 ENCOUNTER — Other Ambulatory Visit: Payer: Self-pay

## 2021-01-18 ENCOUNTER — Ambulatory Visit (INDEPENDENT_AMBULATORY_CARE_PROVIDER_SITE_OTHER): Payer: BC Managed Care – PPO | Admitting: Orthopaedic Surgery

## 2021-01-18 VITALS — BP 122/81 | HR 81 | Ht 70.0 in | Wt 280.0 lb

## 2021-01-18 DIAGNOSIS — M533 Sacrococcygeal disorders, not elsewhere classified: Secondary | ICD-10-CM

## 2021-01-18 DIAGNOSIS — M4807 Spinal stenosis, lumbosacral region: Secondary | ICD-10-CM

## 2021-01-24 NOTE — Progress Notes (Signed)
Office Visit Note   Patient: DEZIYAH ARVIN           Date of Birth: 1986/04/29           MRN: [id number] Visit Date: 01/18/2021              Requested by: Richarda Overlie, MD [address] [place],  [place] [address] PCP: Richarda Overlie, MD   Assessment & Plan: Visit Diagnoses:  1. Coccydynia     Plan: Patient failed conservative treatment including anti-inflammatories trochanteric injection, coccyx injection.  I recommend proceeding with an MRI scan lumbar spine rule out either central disc protrusion or potential sacral stress fracture or sacral lesion.  Office follow-up after MRI scan for review.  Follow-Up Instructions: No follow-ups on file.   Orders:  No orders of the defined types were placed in this encounter.  No orders of the defined types were placed in this encounter.     Procedures: No procedures performed   Clinical Data: No additional findings.   Subjective: Chief Complaint  Patient presents with  . Spine - Pain    HPI 35 year old female returns with continued problems with coccydynia.  She got a trochanteric injection with some relief persistent problems with pain in her coccyx with negative x-rays.  Coccyx injection 12/29/2020 unfortunately did not give her relief other than for a few hours with the local anesthetic.  She has problems with sitting on hard chairs.  She is using anti-inflammatories and a pillow underneath her buttocks 1 side of the other as well as her coccyx pillow without relief.  Patient returns and here with her husband who adds additional history.  Review of Systems updated unchanged from 11/30/2020 office visit.   Objective: Vital Signs: BP 122/81   Pulse 81   Ht 5\' 10"  (1.778 m)   Wt 280 lb (127 kg)   BMI 40.18 kg/m   Physical Exam Constitutional:      Appearance: She is well-developed.  HENT:     Head: Normocephalic.     Right Ear: External ear normal.     Left Ear: External ear normal.  Eyes:      Pupils: Pupils are equal, round, and reactive to light.  Neck:     Thyroid: No thyromegaly.     Trachea: No tracheal deviation.  Cardiovascular:     Rate and Rhythm: Normal rate.  Pulmonary:     Effort: Pulmonary effort is normal.  Abdominal:     Palpations: Abdomen is soft.  Skin:    General: Skin is warm and dry.  Neurological:     Mental Status: She is alert and oriented to person, place, and time.  Psychiatric:        Behavior: Behavior normal.     Ortho Exam  has been to coccyx.  Some tenderness over the sacrum mild tenderness sciatic notch palpation negative logroll of the hips.  Reflexes are 2+ and symmetrical.  Distal pulses are intact. Specialty Ctenderness over theomments:  No specialty comments available.  Imaging: No results found.   PMFS History: Patient Active Problem List   Diagnosis Date Noted  . Coccydynia 12/29/2020  . Trochanteric bursitis, left hip 12/06/2020  . Term pregnancy 09/01/2019  . Pregnancy 08/15/2017  . S/P cesarean section 08/15/2017   Past Medical History:  Diagnosis Date  . Allergy   . Anemia   . Headache     Family History  Problem Relation Age of Onset  . Cancer Maternal Grandmother   .  Hypertension Maternal Grandfather   . Prostate cancer Maternal Uncle   . Diabetes Paternal Aunt   . Diabetes Paternal Uncle   . Pancreatic cancer Maternal Uncle     Past Surgical History:  Procedure Laterality Date  . CESAREAN SECTION N/A 08/15/2017   Procedure: CESAREAN SECTION;  Surgeon: Mitchel Honour, DO;  Location: [facility];  Service: Obstetrics;  Laterality: N/A;  . CESAREAN SECTION N/A 09/01/2019   Procedure: CESAREAN SECTION;  Surgeon: Harold Hedge, MD;  Location: [facility];  Service: Obstetrics;  Laterality: N/A;  . CHOLECYSTECTOMY    . DILATION AND CURETTAGE OF UTERUS    . TONSILLECTOMY    . WISDOM TOOTH EXTRACTION     Social History   Occupational History  . Not on file  Tobacco Use  . Smoking status: Never  Smoker  . Smokeless tobacco: Never Used  Substance and Sexual Activity  . Alcohol use: No  . Drug use: No  . Sexual activity: Not Currently    Birth control/protection: None

## 2021-02-05 ENCOUNTER — Other Ambulatory Visit: Payer: BC Managed Care – PPO

## 2021-04-12 ENCOUNTER — Ambulatory Visit: Payer: BC Managed Care – PPO | Admitting: Dermatology

## 2021-05-04 ENCOUNTER — Ambulatory Visit: Payer: BC Managed Care – PPO | Admitting: Dermatology

## 2021-05-10 ENCOUNTER — Ambulatory Visit (INDEPENDENT_AMBULATORY_CARE_PROVIDER_SITE_OTHER): Payer: BC Managed Care – PPO | Admitting: Dermatology

## 2021-05-10 ENCOUNTER — Other Ambulatory Visit: Payer: Self-pay

## 2021-05-10 DIAGNOSIS — L408 Other psoriasis: Secondary | ICD-10-CM | POA: Diagnosis not present

## 2021-05-10 DIAGNOSIS — L821 Other seborrheic keratosis: Secondary | ICD-10-CM

## 2021-05-10 MED ORDER — DERMA-SMOOTHE/FS SCALP 0.01 % EX OIL: 1.0000 "application " | TOPICAL_OIL | Freq: Every day | CUTANEOUS | 11 refills | Status: AC

## 2021-05-10 MED ORDER — KETOCONAZOLE 2 % EX SHAM: MEDICATED_SHAMPOO | CUTANEOUS | 11 refills | Status: AC

## 2021-05-28 ENCOUNTER — Encounter: Payer: Self-pay | Admitting: Dermatology

## 2021-05-28 NOTE — Progress Notes (Signed)
   Follow-Up Visit   Subjective  Michele Warren is a 35 y.o. female who presents for the following: Follow-up (Here to get refills on her mediation derma smooth and ketoconazole shampoo for her seborrheic dermatitis. Also has lesion on right ear that is getting bigger. ).  Scalp scaling age/gender right ear Location:  Duration:  Quality:  Associated Signs/Symptoms: Modifying Factors:  Severity:  Timing: Context:   Objective  Well appearing patient in no apparent distress; mood and affect are within normal limits. Neck - Posterior, Scalp Moderate inflammatory scale scalp.  No involvement ear canal, retroauricular, face, nails.  Right Ear Pink slightly inflamed and flattened to millimeter scale, and small seborrheic keratosis versus part of her seborrheic dermatitis.    A focused examination was performed including head, neck, hands. Relevant physical exam findings are noted in the Assessment and Plan.   Assessment & Plan    Sebopsoriasis Neck - Posterior; Scalp  Patient has been satisfied with the response to Derma-Smoothe plus ketoconazole shampoo.  She will call me if she flares to discuss other options.  ketoconazole (NIZORAL) 2 % shampoo - Neck - Posterior, Scalp Apply to scalp and let sit 3-5 minutes then rinse.  DERMA-SMOOTHE/FS SCALP 0.01 % OIL - Neck - Posterior, Scalp Apply 1 application topically daily.  Seborrheic keratosis Right Ear  May try the Derma-Smoothe on the spot; if it grows or bleeds return for biopsy.      I, Janalyn Harder, MD, have reviewed all documentation for this visit.  The documentation on 05/28/21 for the exam, diagnosis, procedures, and orders are all accurate and complete.

## 2022-05-10 ENCOUNTER — Ambulatory Visit: Payer: BC Managed Care – PPO | Admitting: Dermatology

## 2022-05-16 ENCOUNTER — Ambulatory Visit: Payer: BC Managed Care – PPO | Admitting: Dermatology
